# Patient Record
Sex: Male | Born: 1985 | Race: White | Hispanic: No | Marital: Married | Smoking: Never smoker
Health system: Southern US, Community
[De-identification: ages and names within clinical notes are randomized; demographics above are authoritative.]

## PROBLEM LIST (undated history)

## (undated) DIAGNOSIS — E559 Vitamin D deficiency, unspecified: Secondary | ICD-10-CM

## (undated) DIAGNOSIS — E785 Hyperlipidemia, unspecified: Secondary | ICD-10-CM

## (undated) DIAGNOSIS — K219 Gastro-esophageal reflux disease without esophagitis: Secondary | ICD-10-CM

## (undated) HISTORY — DX: Vitamin D deficiency, unspecified: E55.9

## (undated) HISTORY — DX: Hyperlipidemia, unspecified: E78.5

## (undated) HISTORY — PX: MANDIBLE FRACTURE SURGERY: SHX706

## (undated) HISTORY — PX: WISDOM TOOTH EXTRACTION: SHX21

## (undated) HISTORY — DX: Gastro-esophageal reflux disease without esophagitis: K21.9

---
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
## 2005-01-19 ENCOUNTER — Encounter: Admission: RE | Admit: 2005-01-19 | Discharge: 2005-01-19 | Payer: Self-pay | Admitting: Surgery

## 2005-06-06 ENCOUNTER — Ambulatory Visit: Payer: Self-pay | Admitting: Family Medicine

## 2005-12-20 ENCOUNTER — Emergency Department (HOSPITAL_COMMUNITY): Admission: EM | Admit: 2005-12-20 | Discharge: 2005-12-21 | Payer: Self-pay | Admitting: Emergency Medicine

## 2010-08-21 ENCOUNTER — Encounter: Payer: Self-pay | Admitting: Surgery

## 2012-10-15 ENCOUNTER — Telehealth: Payer: Self-pay | Admitting: Family Medicine

## 2012-10-15 NOTE — Telephone Encounter (Signed)
NEEDS TO SCHEDULE APPT 

## 2012-10-15 NOTE — Telephone Encounter (Signed)
PT TO BE CALLED ABOUT APPT

## 2012-10-15 NOTE — Telephone Encounter (Signed)
Can you please call and schedule an appointment.

## 2012-10-31 ENCOUNTER — Telehealth: Payer: Self-pay | Admitting: Family Medicine

## 2012-10-31 NOTE — Telephone Encounter (Signed)
He should call referrals and speak to [facility].

## 2012-10-31 NOTE — Telephone Encounter (Signed)
Spoke with grandmother Antonio [name])  Verbalized understanding to return call in am.

## 2012-11-01 ENCOUNTER — Telehealth: Payer: Self-pay | Admitting: Family Medicine

## 2012-11-01 ENCOUNTER — Encounter: Payer: Self-pay | Admitting: Family Medicine

## 2012-11-01 NOTE — Telephone Encounter (Signed)
He can shave. But needs to be seen to treat the ringworm. Use disposable razors.

## 2012-11-01 NOTE — Telephone Encounter (Signed)
Pt notified ok to shave

## 2012-11-01 NOTE — Telephone Encounter (Signed)
Patient advised that 4/15 with Dr. Jorja Loa was quickest appt. available

## 2012-11-11 ENCOUNTER — Ambulatory Visit: Payer: Self-pay | Admitting: Family Medicine

## 2012-12-12 NOTE — Telephone Encounter (Signed)
Pt notified 11/01/12 by Madison Hickman RN

## 2012-12-12 NOTE — Telephone Encounter (Signed)
error 

## 2012-12-17 ENCOUNTER — Other Ambulatory Visit: Payer: Self-pay

## 2012-12-17 ENCOUNTER — Ambulatory Visit: Payer: Self-pay | Admitting: Family Medicine

## 2012-12-26 ENCOUNTER — Other Ambulatory Visit: Payer: Self-pay

## 2013-01-03 ENCOUNTER — Ambulatory Visit (INDEPENDENT_AMBULATORY_CARE_PROVIDER_SITE_OTHER): Admitting: Family Medicine

## 2013-01-03 ENCOUNTER — Ambulatory Visit (INDEPENDENT_AMBULATORY_CARE_PROVIDER_SITE_OTHER)

## 2013-01-03 ENCOUNTER — Encounter: Payer: Self-pay | Admitting: Family Medicine

## 2013-01-03 VITALS — BP 110/63 | HR 69 | Temp 97.4°F | Wt 154.2 lb

## 2013-01-03 DIAGNOSIS — M25529 Pain in unspecified elbow: Secondary | ICD-10-CM

## 2013-01-03 DIAGNOSIS — M25521 Pain in right elbow: Secondary | ICD-10-CM

## 2013-01-03 LAB — POCT CBC
Granulocyte percent: 67.2 %G (ref 37–80)
HCT, POC: 48 % (ref 43.5–53.7)
Hemoglobin: 16.8 g/dL (ref 14.1–18.1)
Lymph, poc: 1.5 (ref 0.6–3.4)
MCH, POC: 30.6 pg (ref 27–31.2)
MCHC: 35.1 g/dL (ref 31.8–35.4)
MCV: 87.74 fL (ref 80–97)
MPV: 7.7 fL (ref 0–99.8)
POC Granulocyte: 3.5 (ref 2–6.9)
POC LYMPH PERCENT: 28 %L (ref 10–50)
Platelet Count, POC: 324 10*3/uL (ref 142–424)
RBC: 5.5 M/uL (ref 4.69–6.13)
RDW, POC: 11.9 %
WBC: 5.2 10*3/uL (ref 4.6–10.2)

## 2013-01-03 NOTE — Patient Instructions (Addendum)
Take two alleve twice a day with food for 1 week. Apply ice packs and icy hot or ben gays to the arm. Return if not better in a couple of weeks.  Eryck Negron P. Modesto Charon, M.D.

## 2013-01-03 NOTE — Progress Notes (Signed)
Patient ID: Antonio [name], male   DOB: 15-Dec-1985, 27 y.o.   MRN: [id number] SUBJECTIVE: Chief Complaint  Patient presents with  . Follow-up    3 month follow up c/o rt shoulder painful no known injury.  non fasting   HPI: Slept on a bed one night. It was hurting the next day. Got better, then the pain started up again. 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  PMH/PSH: reviewed/updated in Epic  SH/FH: reviewed/updated in Epic  Allergies: reviewed/updated in Epic  Medications: reviewed/updated in Epic  Immunizations: reviewed/updated in Epic  ROS: As above in the HPI. All other systems are stable or negative.  OBJECTIVE: APPEARANCE:  Patient in no acute distress.The patient appeared well nourished and normally developed. Acyanotic. Waist: VITAL SIGNS:BP 110/63  Pulse 69  Temp(Src) 97.4 F (36.3 C) (Oral)  Wt 154 lb 3.2 oz (69.945 kg) Slim built. WM  SKIN: warm and  Dry without overt rashes, tattoos and scars  HEAD and Neck: without JVD, Head and scalp: normal Eyes:No scleral icterus. Fundi normal, eye movements normal. Ears: Auricle normal, canal normal, Tympanic membranes normal, insufflation normal. Nose: normal Throat: normal Neck & thyroid: normal  CHEST & LUNGS: Chest wall: normal Lungs: Clear  CVS: Reveals the PMI to be normally located. Regular rhythm, First and Second Heart sounds are normal,  absence of murmurs, rubs or gallops. Peripheral vasculature: Radial pulses: normal Dorsal pedis pulses: normal Posterior pulses: normal  ABDOMEN:  Appearance: normal Benign, no organomegaly, no masses, no Abdominal Aortic enlargement. No Guarding , no rebound. No Bruits. Bowel sounds: normal  RECTAL: N/A GU: N/A  EXTREMETIES: nonedematous. Both Femoral and Pedal pulses are normal.  MUSCULOSKELETAL:  Spine: normal Joints: intact Right mid humerus tender to palpation and the lateral head of the right triceps is also tender.  NEUROLOGIC: oriented to  time,place and person; nonfocal. Strength is normal Sensory is normal Reflexes are normal Cranial Nerves are normal.  ASSESSMENT: Pain in joint, upper arm, right - Plan: POCT CBC, DG Humerus Right   PLAN: [facility] reading (PRIMARY) by  Dr. Modesto Charon: no acute findings of the humerus. No bone abnormality no fractures.                             Take two alleve twice a day with food for 1 week. Apply ice packs and icy hot or ben gays to the arm. Return if not better in a couple of weeks.  Lakeyn Dokken P. Modesto Charon, M.D.

## 2013-06-03 ENCOUNTER — Ambulatory Visit: Admitting: Family Medicine

## 2013-06-05 ENCOUNTER — Ambulatory Visit: Admitting: Family Medicine

## 2013-08-01 ENCOUNTER — Encounter: Payer: Self-pay | Admitting: Family Medicine

## 2013-08-01 ENCOUNTER — Ambulatory Visit (INDEPENDENT_AMBULATORY_CARE_PROVIDER_SITE_OTHER): Admitting: Family Medicine

## 2013-08-01 ENCOUNTER — Ambulatory Visit (INDEPENDENT_AMBULATORY_CARE_PROVIDER_SITE_OTHER)

## 2013-08-01 VITALS — BP 100/71 | HR 70 | Temp 98.1°F | Ht 67.0 in | Wt 154.4 lb

## 2013-08-01 DIAGNOSIS — R109 Unspecified abdominal pain: Secondary | ICD-10-CM | POA: Insufficient documentation

## 2013-08-01 DIAGNOSIS — R0609 Other forms of dyspnea: Secondary | ICD-10-CM

## 2013-08-01 DIAGNOSIS — E785 Hyperlipidemia, unspecified: Secondary | ICD-10-CM | POA: Insufficient documentation

## 2013-08-01 DIAGNOSIS — R5381 Other malaise: Secondary | ICD-10-CM | POA: Insufficient documentation

## 2013-08-01 DIAGNOSIS — E559 Vitamin D deficiency, unspecified: Secondary | ICD-10-CM

## 2013-08-01 DIAGNOSIS — R0789 Other chest pain: Secondary | ICD-10-CM

## 2013-08-01 DIAGNOSIS — R0989 Other specified symptoms and signs involving the circulatory and respiratory systems: Secondary | ICD-10-CM

## 2013-08-01 DIAGNOSIS — R5383 Other fatigue: Principal | ICD-10-CM

## 2013-08-01 DIAGNOSIS — B354 Tinea corporis: Secondary | ICD-10-CM | POA: Insufficient documentation

## 2013-08-01 DIAGNOSIS — K219 Gastro-esophageal reflux disease without esophagitis: Secondary | ICD-10-CM | POA: Insufficient documentation

## 2013-08-01 DIAGNOSIS — R0600 Dyspnea, unspecified: Secondary | ICD-10-CM

## 2013-08-01 DIAGNOSIS — M25529 Pain in unspecified elbow: Secondary | ICD-10-CM

## 2013-08-01 DIAGNOSIS — R079 Chest pain, unspecified: Secondary | ICD-10-CM | POA: Insufficient documentation

## 2013-08-01 LAB — POCT CBC
Granulocyte percent: 62 %G (ref 37–80)
HCT, POC: 45 % (ref 43.5–53.7)
Hemoglobin: 14.8 g/dL (ref 14.1–18.1)
Lymph, poc: 1.4 (ref 0.6–3.4)
MCH, POC: 28.7 pg (ref 27–31.2)
MCHC: 33 g/dL (ref 31.8–35.4)
MCV: 86.9 fL (ref 80–97)
MPV: 7.5 fL (ref 0–99.8)
POC Granulocyte: 2.8 (ref 2–6.9)
POC LYMPH PERCENT: 30.5 %L (ref 10–50)
Platelet Count, POC: 291 10*3/uL (ref 142–424)
RBC: 5.2 M/uL (ref 4.69–6.13)
RDW, POC: 11.9 %
WBC: 4.5 10*3/uL — AB (ref 4.6–10.2)

## 2013-08-01 MED ORDER — CITALOPRAM HYDROBROMIDE 20 MG PO TABS: 20.0000 mg | ORAL_TABLET | Freq: Every day | ORAL | Status: DC

## 2013-08-01 MED ORDER — PANTOPRAZOLE SODIUM 40 MG PO TBEC: 40.0000 mg | DELAYED_RELEASE_TABLET | Freq: Every day | ORAL | Status: DC

## 2013-08-01 MED ORDER — KETOCONAZOLE 2 % EX CREA: 1.0000 "application " | TOPICAL_CREAM | Freq: Two times a day (BID) | CUTANEOUS | Status: AC

## 2013-08-01 MED ORDER — CHOLECALCIFEROL 50 MCG (2000 UT) PO CAPS: 2000.0000 [IU] | ORAL_CAPSULE | Freq: Every day | ORAL | Status: DC

## 2013-08-01 NOTE — Patient Instructions (Signed)
Depression, Adult Depression refers to feeling sad, low, down in the dumps, blue, gloomy, or empty. In general, there are two kinds of depression: 1. Depression that we all experience from time to time because of upsetting life experiences, including the loss of a job or the ending of a relationship (normal sadness or normal grief). This kind of depression is considered normal, is short lived, and resolves within a few days to 2 weeks. (Depression experienced after the loss of a loved one is called bereavement. Bereavement often lasts longer than 2 weeks but normally gets better with time.) 2. Clinical depression, which lasts longer than normal sadness or normal grief or interferes with your ability to function at home, at work, and in school. It also interferes with your personal relationships. It affects almost every aspect of your life. Clinical depression is an illness. Symptoms of depression also can be caused by conditions other than normal sadness and grief or clinical depression. Examples of these conditions are listed as follows:  Physical illness Some physical illnesses, including underactive thyroid gland (hypothyroidism), severe anemia, specific types of cancer, diabetes, uncontrolled seizures, heart and lung problems, strokes, and chronic pain are commonly associated with symptoms of depression.  Side effects of some prescription medicine In some people, certain types of prescription medicine can cause symptoms of depression.  Substance abuse Abuse of alcohol and illicit drugs can cause symptoms of depression. SYMPTOMS Symptoms of normal sadness and normal grief include the following:  Feeling sad or crying for short periods of time.  Not caring about anything (apathy).  Difficulty sleeping or sleeping too much.  No longer able to enjoy the things you used to enjoy.  Desire to be by oneself all the time (social isolation).  Lack of energy or motivation.  Difficulty  concentrating or remembering.  Change in appetite or weight.  Restlessness or agitation. Symptoms of clinical depression include the same symptoms of normal sadness or normal grief and also the following symptoms:  Feeling sad or crying all the time.  Feelings of guilt or worthlessness.  Feelings of hopelessness or helplessness.  Thoughts of suicide or the desire to harm yourself (suicidal ideation).  Loss of touch with reality (psychotic symptoms). Seeing or hearing things that are not real (hallucinations) or having false beliefs about your life or the people around you (delusions and paranoia). DIAGNOSIS  The diagnosis of clinical depression usually is based on the severity and duration of the symptoms. Your caregiver also will ask you questions about your medical history and substance use to find out if physical illness, use of prescription medicine, or substance abuse is causing your depression. Your caregiver also may order blood tests. TREATMENT  Typically, normal sadness and normal grief do not require treatment. However, sometimes antidepressant medicine is prescribed for bereavement to ease the depressive symptoms until they resolve. The treatment for clinical depression depends on the severity of your symptoms but typically includes antidepressant medicine, counseling with a mental health professional, or a combination of both. Your caregiver will help to determine what treatment is best for you. Depression caused by physical illness usually goes away with appropriate medical treatment of the illness. If prescription medicine is causing depression, talk with your caregiver about stopping the medicine, decreasing the dose, or substituting another medicine. Depression caused by abuse of alcohol or illicit drugs abuse goes away with abstinence from these substances. Some adults need professional help in order to stop drinking or using drugs. SEEK IMMEDIATE CARE IF:  You have   thoughts  about hurting yourself or others.  You lose touch with reality (have psychotic symptoms).  You are taking medicine for depression and have a serious side effect. FOR MORE INFORMATION National Alliance on Mental Illness: www.nami.[employer] of Mental Health: https://carter.com/ Document Released: 07/14/2000 Document Revised: 01/16/2012 Document Reviewed: 10/16/2011 [facility] Patient Information 2014 [place].   Alcohol Problems Most adults who drink alcohol drink in moderation (not a lot) are at low risk for developing problems related to their drinking. However, all drinkers, including low-risk drinkers, should know about the health risks connected with drinking alcohol. RECOMMENDATIONS FOR LOW-RISK DRINKING  Drink in moderation. Moderate drinking is defined as follows:   Men - no more than 2 drinks per day.  Nonpregnant women - no more than 1 drink per day.  Over age 74 - no more than 1 drink per day. A standard drink is 12 grams of pure alcohol, which is equal to a 12 ounce bottle of beer or wine cooler, a 5 ounce glass of wine, or 1.5 ounces of distilled spirits (such as whiskey, brandy, vodka, or rum).  ABSTAIN FROM (DO NOT DRINK) ALCOHOL:  When pregnant or considering pregnancy.  When taking a medication that interacts with alcohol.  If you are alcohol dependent.  A medical condition that prohibits drinking alcohol (such as ulcer, liver disease, or heart disease). DISCUSS WITH YOUR CAREGIVER:  If you are at risk for coronary heart disease, discuss the potential benefits and risks of alcohol use: Light to moderate drinking is associated with lower rates of coronary heart disease in certain populations (for example, men over age 36 and postmenopausal women). Infrequent or nondrinkers are advised not to begin light to moderate drinking to reduce the risk of coronary heart disease so as to avoid creating an alcohol-related problem. Similar protective effects can  likely be gained through proper diet and exercise.  Women and the elderly have smaller amounts of body water than men. As a result women and the elderly achieve a higher blood alcohol concentration after drinking the same amount of alcohol.  Exposing a fetus to alcohol can cause a broad range of birth defects referred to as Fetal Alcohol Syndrome (FAS) or Alcohol-Related Birth Defects (ARBD). Although FAS/ARBD is connected with excessive alcohol consumption during pregnancy, studies also have reported neurobehavioral problems in infants born to mothers reporting drinking an average of 1 drink per day during pregnancy.  Heavier drinking (the consumption of more than 4 drinks per occasion by men and more than 3 drinks per occasion by women) impairs learning (cognitive) and psychomotor functions and increases the risk of alcohol-related problems, including accidents and injuries. CAGE QUESTIONS:   Have you ever felt that you should Cut down on your drinking?  Have people Annoyed you by criticizing your drinking?  Have you ever felt bad or Guilty about your drinking?  Have you ever had a drink first thing in the morning to steady your nerves or get rid of a hangover (Eye opener)? If you answered positively to any of these questions: You may be at risk for alcohol-related problems if alcohol consumption is:   Men: Greater than 14 drinks per week or more than 4 drinks per occasion.  Women: Greater than 7 drinks per week or more than 3 drinks per occasion. Do you or your family have a medical history of alcohol-related problems, such as:  Blackouts.  Sexual dysfunction.  Depression.  Trauma.  Liver dysfunction.  Sleep disorders.  Hypertension.  Chronic abdominal  pain.  Has your drinking ever caused you problems, such as problems with your family, problems with your work (or school) performance, or accidents/injuries?  Do you have a compulsion to drink or a preoccupation with  drinking?  Do you have poor control or are you unable to stop drinking once you have started?  Do you have to drink to avoid withdrawal symptoms?  Do you have problems with withdrawal such as tremors, nausea, sweats, or mood disturbances?  Does it take more alcohol than in the past to get you high?  Do you feel a strong urge to drink?  Do you change your plans so that you can have a drink?  Do you ever drink in the morning to relieve the shakes or a hangover? If you have answered a number of the previous questions positively, it may be time for you to talk to your caregivers, family, and friends and see if they think you have a problem. Alcoholism is a chemical dependency that keeps getting worse and will eventually destroy your health and relationships. Many alcoholics end up dead, impoverished, or in prison. This is often the end result of all chemical dependency.  Do not be discouraged if you are not ready to take action immediately.  Decisions to change behavior often involve up and down desires to change and feeling like you cannot decide.  Try to think more seriously about your drinking behavior.  Think of the reasons to quit. WHERE TO GO FOR ADDITIONAL INFORMATION   The [place] on Alcohol Abuse and Alcoholism (NIAAA) http://www.bradshaw.com/  [employer] on Alcoholism and Drug Dependence (NCADD) www.ncadd.[place] (ASAM) http://carpenter.net/  Document Released: 07/17/2005 Document Revised: 10/09/2011 Document Reviewed: 03/04/2008 [facility] Patient Information 2014 [place].   Gastroesophageal Reflux Disease, Adult Gastroesophageal reflux disease (GERD) happens when acid from your stomach flows up into the esophagus. When acid comes in contact with the esophagus, the acid causes soreness (inflammation) in the esophagus. Over time, GERD may create small holes (ulcers) in the lining of the esophagus. CAUSES   Increased body  weight. This puts pressure on the stomach, making acid rise from the stomach into the esophagus.  Smoking. This increases acid production in the stomach.  Drinking alcohol. This causes decreased pressure in the lower esophageal sphincter (valve or ring of muscle between the esophagus and stomach), allowing acid from the stomach into the esophagus.  Late evening meals and a full stomach. This increases pressure and acid production in the stomach.  A malformed lower esophageal sphincter. Sometimes, no cause is found. SYMPTOMS   Burning pain in the lower part of the mid-chest behind the breastbone and in the mid-stomach area. This may occur twice a week or more often.  Trouble swallowing.  Sore throat.  Dry cough.  Asthma-like symptoms including chest tightness, shortness of breath, or wheezing. DIAGNOSIS  Your caregiver may be able to diagnose GERD based on your symptoms. In some cases, X-rays and other tests may be done to check for complications or to check the condition of your stomach and esophagus. TREATMENT  Your caregiver may recommend over-the-counter or prescription medicines to help decrease acid production. Ask your caregiver before starting or adding any new medicines.  HOME CARE INSTRUCTIONS   Change the factors that you can control. Ask your caregiver for guidance concerning weight loss, quitting smoking, and alcohol consumption.  Avoid foods and drinks that make your symptoms worse, such as:  Caffeine or alcoholic drinks.  Chocolate.  Peppermint or mint flavorings.  Garlic and onions.  Spicy foods.  Citrus fruits, such as oranges, lemons, or limes.  Tomato-based foods such as sauce, chili, salsa, and pizza.  Fried and fatty foods.  Avoid lying down for the 3 hours prior to your bedtime or prior to taking a nap.  Eat small, frequent meals instead of large meals.  Wear loose-fitting clothing. Do not wear anything tight around your waist that causes  pressure on your stomach.  Raise the head of your bed 6 to 8 inches with wood blocks to help you sleep. Extra pillows will not help.  Only take over-the-counter or prescription medicines for pain, discomfort, or fever as directed by your caregiver.  Do not take aspirin, ibuprofen, or other nonsteroidal anti-inflammatory drugs (NSAIDs). SEEK IMMEDIATE MEDICAL CARE IF:   You have pain in your arms, neck, jaw, teeth, or back.  Your pain increases or changes in intensity or duration.  You develop nausea, vomiting, or sweating (diaphoresis).  You develop shortness of breath, or you faint.  Your vomit is green, yellow, black, or looks like coffee grounds or blood.  Your stool is red, bloody, or black. These symptoms could be signs of other problems, such as heart disease, gastric bleeding, or esophageal bleeding. MAKE SURE YOU:   Understand these instructions.  Will watch your condition.  Will get help right away if you are not doing well or get worse. Document Released: 04/26/2005 Document Revised: 10/09/2011 Document Reviewed: 02/03/2011 [facility] Patient Information 2014 [place], [place].
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
## 2013-08-05 LAB — CMP14+EGFR
ALT: 15 IU/L (ref 0–44)
AST: 20 IU/L (ref 0–40)
Albumin/Globulin Ratio: 2.1 (ref 1.1–2.5)
Albumin: 4.6 g/dL (ref 3.5–5.5)
Alkaline Phosphatase: 74 IU/L (ref 39–117)
BUN/Creatinine Ratio: 11 (ref 8–19)
BUN: 11 mg/dL (ref 6–20)
CO2: 26 mmol/L (ref 18–29)
Calcium: 10 mg/dL (ref 8.7–10.2)
Chloride: 97 mmol/L (ref 97–108)
Creatinine, Ser: 1.01 mg/dL (ref 0.76–1.27)
GFR calc Af Amer: 117 mL/min/{1.73_m2} (ref >59)
GFR calc non Af Amer: 101 mL/min/{1.73_m2} (ref >59)
Globulin, Total: 2.2 g/dL (ref 1.5–4.5)
Glucose: 88 mg/dL (ref 65–99)
Potassium: 4.7 mmol/L (ref 3.5–5.2)
Sodium: 140 mmol/L (ref 134–144)
Total Bilirubin: 1 mg/dL (ref 0.0–1.2)
Total Protein: 6.8 g/dL (ref 6.0–8.5)

## 2013-08-05 LAB — FOLATE: Folate: 18.9 ng/mL (ref >3.0)

## 2013-08-05 LAB — VITAMIN D 25 HYDROXY (VIT D DEFICIENCY, FRACTURES): Vit D, 25-Hydroxy: 15.2 ng/mL — ABNORMAL LOW (ref 30.0–100.0)

## 2013-08-05 LAB — HIV ANTIBODY (ROUTINE TESTING W REFLEX)
HIV 1/O/2 Abs-Index Value: <1 (ref <1.00)
HIV-1/HIV-2 Ab: NONREACTIVE

## 2013-08-05 LAB — LIPID PANEL
Chol/HDL Ratio: 5.2 ratio units — ABNORMAL HIGH (ref 0.0–5.0)
Cholesterol, Total: 192 mg/dL (ref 100–199)
HDL: 37 mg/dL — ABNORMAL LOW (ref >39)
LDL Calculated: 121 mg/dL — ABNORMAL HIGH (ref 0–99)
Triglycerides: 168 mg/dL — ABNORMAL HIGH (ref 0–149)
VLDL Cholesterol Cal: 34 mg/dL (ref 5–40)

## 2013-08-05 LAB — HEPATITIS PANEL, ACUTE
Hep A IgM: NEGATIVE
Hep B C IgM: NEGATIVE
Hep C Virus Ab: 0.1 s/co ratio (ref 0.0–0.9)
Hepatitis B Surface Ag: NEGATIVE

## 2013-08-05 LAB — SEDIMENTATION RATE: Sed Rate: 2 mm/hr (ref 0–15)

## 2013-08-05 LAB — H PYLORI, IGM, IGG, IGA AB
H Pylori IgG: <0.9 U/mL (ref 0.0–0.8)
H. pylori, IgA Abs: <9 units (ref 0.0–8.9)
H. pylori, IgM Abs: <9 units (ref 0.0–8.9)

## 2013-08-05 LAB — TSH: TSH: 1.59 u[IU]/mL (ref 0.450–4.500)

## 2013-08-05 LAB — VITAMIN B12: Vitamin B-12: 663 pg/mL (ref 211–946)

## 2013-08-05 LAB — RPR: RPR: NONREACTIVE

## 2013-08-06 NOTE — Progress Notes (Signed)
Quick Note:  Call Patient Labs that are abnormal: Vit D too low Lipids are abnormal The rest are at goal  Recommendations: Double your Vit d dose to 4000 units daily. This is OTC. Will discuss at follow up visit in 4 weeks.   ______

## 2013-08-12 ENCOUNTER — Telehealth: Payer: Self-pay | Admitting: *Deleted

## 2013-08-12 NOTE — Telephone Encounter (Signed)
RETURNING NURSE CALL FOR LAB RESULTS.  CALL HIM AT [phone]

## 2013-09-02 ENCOUNTER — Telehealth: Payer: Self-pay | Admitting: Family Medicine

## 2013-09-02 ENCOUNTER — Other Ambulatory Visit: Payer: Self-pay | Admitting: Family Medicine

## 2013-09-02 ENCOUNTER — Ambulatory Visit: Admitting: Family Medicine

## 2013-09-02 DIAGNOSIS — R5383 Other fatigue: Secondary | ICD-10-CM

## 2013-09-02 DIAGNOSIS — K219 Gastro-esophageal reflux disease without esophagitis: Secondary | ICD-10-CM

## 2013-09-02 DIAGNOSIS — R5381 Other malaise: Secondary | ICD-10-CM

## 2013-09-02 MED ORDER — PANTOPRAZOLE SODIUM 40 MG PO TBEC: 40.0000 mg | DELAYED_RELEASE_TABLET | Freq: Every day | ORAL | Status: DC

## 2013-09-02 MED ORDER — CITALOPRAM HYDROBROMIDE 20 MG PO TABS: 20.0000 mg | ORAL_TABLET | Freq: Every day | ORAL | Status: DC

## 2013-09-02 NOTE — Telephone Encounter (Signed)
Call patient : Prescription refilled & sent to pharmacy in [place]. 

## 2013-09-08 ENCOUNTER — Ambulatory Visit (INDEPENDENT_AMBULATORY_CARE_PROVIDER_SITE_OTHER): Admitting: Family Medicine

## 2013-09-08 ENCOUNTER — Encounter: Payer: Self-pay | Admitting: Family Medicine

## 2013-09-08 VITALS — BP 102/64 | HR 79 | Temp 97.5°F | Ht 67.0 in | Wt 148.6 lb

## 2013-09-08 DIAGNOSIS — L739 Follicular disorder, unspecified: Secondary | ICD-10-CM | POA: Insufficient documentation

## 2013-09-08 DIAGNOSIS — R5381 Other malaise: Secondary | ICD-10-CM

## 2013-09-08 DIAGNOSIS — B079 Viral wart, unspecified: Secondary | ICD-10-CM

## 2013-09-08 DIAGNOSIS — L738 Other specified follicular disorders: Secondary | ICD-10-CM

## 2013-09-08 DIAGNOSIS — K219 Gastro-esophageal reflux disease without esophagitis: Secondary | ICD-10-CM

## 2013-09-08 DIAGNOSIS — E785 Hyperlipidemia, unspecified: Secondary | ICD-10-CM

## 2013-09-08 DIAGNOSIS — Z8349 Family history of other endocrine, nutritional and metabolic diseases: Secondary | ICD-10-CM | POA: Insufficient documentation

## 2013-09-08 DIAGNOSIS — E559 Vitamin D deficiency, unspecified: Secondary | ICD-10-CM

## 2013-09-08 DIAGNOSIS — R5383 Other fatigue: Principal | ICD-10-CM

## 2013-09-08 DIAGNOSIS — B078 Other viral warts: Secondary | ICD-10-CM | POA: Insufficient documentation

## 2013-09-08 DIAGNOSIS — L678 Other hair color and hair shaft abnormalities: Secondary | ICD-10-CM

## 2013-09-08 MED ORDER — SULFAMETHOXAZOLE-TMP DS 800-160 MG PO TABS: 1.0000 | ORAL_TABLET | Freq: Two times a day (BID) | ORAL | Status: DC

## 2013-09-08 NOTE — Patient Instructions (Signed)
      Dr Derrian Poli's Recommendations  For nutrition information, I recommend books:  1).Eat to Live by Dr Joel Fuhrman. 2).Prevent and Reverse Heart Disease by Dr Caldwell Esselstyn. 3) Dr Neal Barnard's Book:  Program to Reverse Diabetes  Exercise recommendations are:  If unable to walk, then the patient can exercise in a chair 3 times a day. By flapping arms like a bird gently and raising legs outwards to the front.  If ambulatory, the patient can go for walks for 30 minutes 3 times a week. Then increase the intensity and duration as tolerated.  Goal is to try to attain exercise frequency to 5 times a week.  If applicable: Best to perform resistance exercises (machines or weights) 2 days a week and cardio type exercises 3 days per week.  

## 2013-09-08 NOTE — Progress Notes (Signed)
Patient ID: Antonio [name], male   DOB: Sep 10, 1985, 28 y.o.   MRN: [id number] SUBJECTIVE: CC: Chief Complaint  Patient presents with  . Follow-up    4 week reck since starting celexa "mood is better"    HPI: Fatigue and mood has been better. But did not know that he has refills and ran out 1 week ago of the citalopram  Has only taken vit D 2000 units. Instead of the 4000 units as recommended.  Came for follow up.  Has warts on his palms.  GERD is better with the pantoprazole Past Medical History  Diagnosis Date  . GERD (gastroesophageal reflux disease)   . Vitamin D deficiency   . HLD (hyperlipidemia)    No past surgical history on file. History   Social History  . Marital Status: Married    Spouse Name: N/A    Number of Children: N/A  . Years of Education: N/A   Occupational History  . Not on file.   Social History Main Topics  . Smoking status: Never Smoker   . Smokeless tobacco: Not on file  . Alcohol Use: Not on file  . Drug Use: Not on file  . Sexual Activity: Not on file   Other Topics Concern  . Not on file   Social History Narrative  . No narrative on file   No family history on file. Current Outpatient Prescriptions on File Prior to Visit  Medication Sig Dispense Refill  . citalopram (CELEXA) 20 MG tablet Take 1 tablet (20 mg total) by mouth daily.  30 tablet  3  . pantoprazole (PROTONIX) 40 MG tablet Take 1 tablet (40 mg total) by mouth daily.  30 tablet  3   No current facility-administered medications on file prior to visit.   No Known Allergies Immunization History  Administered Date(s) Administered  . Influenza-Unspecified 06/09/2013  . Tdap 08/01/2007   Prior to Admission medications   Medication Sig Start Date End Date Taking? Authorizing Provider  Cholecalciferol 2000 UNITS CAPS Take 1 capsule (2,000 Units total) by mouth daily. 08/01/13  Yes Vernie Shanks, MD  citalopram (CELEXA) 20 MG tablet Take 1 tablet (20 mg total) by mouth  daily. 09/02/13  Yes Vernie Shanks, MD  pantoprazole (PROTONIX) 40 MG tablet Take 1 tablet (40 mg total) by mouth daily. 09/02/13  Yes Vernie Shanks, MD     ROS: As above in the HPI. All other systems are stable or negative.  OBJECTIVE: APPEARANCE:  Patient in no acute distress.The patient appeared well nourished and normally developed. Acyanotic. Waist: VITAL SIGNS:BP 102/64  Pulse 79  Temp(Src) 97.5 F (36.4 C) (Oral)  Ht '5\' 7"'  (1.702 m)  Wt 148 lb 9.6 oz (67.405 kg)  BMI 23.27 kg/m2 WM  SKIN: warm and  Dry verrucous warts of the medial palms of the right hand.  Has impetigo lesion in the occiput with the hair follicles.  HEAD and Neck: without JVD, Head and scalp: normal Eyes:No scleral icterus. Fundi normal, eye movements normal. Ears: Auricle normal, canal normal, Tympanic membranes normal, insufflation normal. Nose: normal Throat: normal Neck & thyroid: normal  CHEST & LUNGS: Chest wall: normal Lungs: Clear  CVS: Reveals the PMI to be normally located. Regular rhythm, First and Second Heart sounds are normal,  absence of murmurs, rubs or gallops. Peripheral vasculature: Radial pulses: normal Dorsal pedis pulses: normal Posterior pulses: normal  ABDOMEN:  Appearance: normal Benign, no organomegaly, no masses, no Abdominal Aortic enlargement. No Guarding , no  rebound. No Bruits. Bowel sounds: normal  RECTAL: N/A GU: N/A  EXTREMETIES: nonedematous.  MUSCULOSKELETAL:  Spine: normal Joints: intact  NEUROLOGIC: oriented to time,place and person; nonfocal. Strength is normal Sensory is normal Reflexes are normal Cranial Nerves are normal. Psychiatry: fatigue better.no delusion no hallucinations and no suicidal thoughts.  Results for orders placed in visit on 08/01/13  CMP14+EGFR      Result Value Range   Glucose 88  65 - 99 mg/dL   BUN 11  6 - 20 mg/dL   Creatinine, Ser 1.01  0.76 - 1.27 mg/dL   GFR calc non Af Amer 101  >59 mL/min/1.73   GFR  calc Af Amer 117  >59 mL/min/1.73   BUN/Creatinine Ratio 11  8 - 19   Sodium 140  134 - 144 mmol/L   Potassium 4.7  3.5 - 5.2 mmol/L   Chloride 97  97 - 108 mmol/L   CO2 26  18 - 29 mmol/L   Calcium 10.0  8.7 - 10.2 mg/dL   Total Protein 6.8  6.0 - 8.5 g/dL   Albumin 4.6  3.5 - 5.5 g/dL   Globulin, Total 2.2  1.5 - 4.5 g/dL   Albumin/Globulin Ratio 2.1  1.1 - 2.5   Total Bilirubin 1.0  0.0 - 1.2 mg/dL   Alkaline Phosphatase 74  39 - 117 IU/L   AST 20  0 - 40 IU/L   ALT 15  0 - 44 IU/L  VITAMIN D 25 HYDROXY      Result Value Range   Vit D, 25-Hydroxy 15.2 (*) 30.0 - 100.0 ng/mL  VITAMIN B12      Result Value Range   Vitamin B-12 663  211 - 946 pg/mL  FOLATE      Result Value Range   Folate 18.9  >3.0 ng/mL  SEDIMENTATION RATE      Result Value Range   Sed Rate 2  0 - 15 mm/hr  RPR      Result Value Range   RPR Non Reactive  Non Reactive  HEPATITIS PANEL, ACUTE      Result Value Range   Hep A IgM Negative  Negative   Hepatitis B Surface Ag Negative  Negative   Hep B C IgM Negative  Negative   Hep C Virus Ab 0.1  0.0 - 0.9 s/co ratio  HIV ANTIBODY (ROUTINE TESTING)      Result Value Range   HIV 1/O/2 Abs-Index Value <1.00  <1.00   HIV-1/HIV-2 Ab Non Reactive  Non Reactive  LIPID PANEL      Result Value Range   Cholesterol, Total 192  100 - 199 mg/dL   Triglycerides 168 (*) 0 - 149 mg/dL   HDL 37 (*) >39 mg/dL   VLDL Cholesterol Cal 34  5 - 40 mg/dL   LDL Calculated 121 (*) 0 - 99 mg/dL   Chol/HDL Ratio 5.2 (*) 0.0 - 5.0 ratio units  TSH      Result Value Range   TSH 1.590  0.450 - 4.500 uIU/mL  H PYLORI, IGM, IGG, IGA AB      Result Value Range   H Pylori IgG <0.9  0.0 - 0.8 U/mL   H. pylori, IgA Abs <9.0  0.0 - 8.9 units   H. pylori, IgM Abs <9.0  0.0 - 8.9 units  POCT CBC      Result Value Range   WBC 4.5 (*) 4.6 - 10.2 K/uL   Lymph, poc 1.4  0.6 -  3.4   POC LYMPH PERCENT 30.5  10 - 50 %L   MID (cbc)    0 - 0.9   POC MID %    0 - 12 %M   POC Granulocyte  2.8  2 - 6.9   Granulocyte percent 62.0  37 - 80 %G   RBC 5.2  4.69 - 6.13 M/uL   Hemoglobin 14.8  14.1 - 18.1 g/dL   HCT, POC 45.0  43.5 - 53.7 %   MCV 86.9  80 - 97 fL   MCH, POC 28.7  27 - 31.2 pg   MCHC 33.0  31.8 - 35.4 g/dL   RDW, POC 11.9     Platelet Count, POC 291.0  142 - 424 K/uL   MPV 7.5  0 - 99.8 fL    ASSESSMENT:  Other malaise and fatigue  HLD (hyperlipidemia)  Vitamin D deficiency - Plan: Cholecalciferol 2000 UNITS CAPS  GERD (gastroesophageal reflux disease)  Follicular impetigo - Plan: sulfamethoxazole-trimethoprim (BACTRIM DS) 800-160 MG per tablet  Wart viral  Family history of hemochromatosis Need for compliance and explained that he has refills.  PLAN: Compliance discussed Skin care Duct  Tape treatment for warts discussed. Consider increase the dose of the citalopram in 4 weeks.  No orders of the defined types were placed in this encounter.   Meds ordered this encounter  Medications  . sulfamethoxazole-trimethoprim (BACTRIM DS) 800-160 MG per tablet    Sig: Take 1 tablet by mouth 2 (two) times daily.    Dispense:  20 tablet    Refill:  0  . Cholecalciferol 2000 UNITS CAPS    Sig: Take 2 capsules (4,000 Units total) by mouth daily.    Dispense:  60 each    Refill:  3   Medications Discontinued During This Encounter  Medication Reason  . Cholecalciferol 2000 UNITS CAPS Reorder       Dr Paula Libra Recommendations  For nutrition information, I recommend books:  1).Eat to Live by Dr Excell Seltzer. 2).Prevent and Reverse Heart Disease by Dr Karl Luke. 3) Dr Janene Harvey Book:  Program to Reverse Diabetes  Exercise recommendations are:  If unable to walk, then the patient can exercise in a chair 3 times a day. By flapping arms like a bird gently and raising legs outwards to the front.  If ambulatory, the patient can go for walks for 30 minutes 3 times a week. Then increase the intensity and duration as tolerated.  Goal is  to try to attain exercise frequency to 5 times a week.  If applicable: Best to perform resistance exercises (machines or weights) 2 days a week and cardio type exercises 3 days per week.  Return in about 4 weeks (around 10/06/2013) for Recheck medical problems.  Diantha Paxson P. Jacelyn Grip, M.D.

## 2013-10-09 ENCOUNTER — Encounter: Payer: Self-pay | Admitting: Family Medicine

## 2013-10-09 ENCOUNTER — Ambulatory Visit (INDEPENDENT_AMBULATORY_CARE_PROVIDER_SITE_OTHER): Admitting: Family Medicine

## 2013-10-09 VITALS — BP 117/79 | HR 60 | Temp 97.4°F | Ht 69.0 in | Wt 147.2 lb

## 2013-10-09 DIAGNOSIS — L738 Other specified follicular disorders: Secondary | ICD-10-CM

## 2013-10-09 DIAGNOSIS — L739 Follicular disorder, unspecified: Secondary | ICD-10-CM

## 2013-10-09 DIAGNOSIS — K219 Gastro-esophageal reflux disease without esophagitis: Secondary | ICD-10-CM

## 2013-10-09 DIAGNOSIS — E559 Vitamin D deficiency, unspecified: Secondary | ICD-10-CM

## 2013-10-09 DIAGNOSIS — B079 Viral wart, unspecified: Secondary | ICD-10-CM

## 2013-10-09 DIAGNOSIS — R5381 Other malaise: Secondary | ICD-10-CM

## 2013-10-09 DIAGNOSIS — R5383 Other fatigue: Principal | ICD-10-CM

## 2013-10-09 DIAGNOSIS — E785 Hyperlipidemia, unspecified: Secondary | ICD-10-CM

## 2013-10-09 DIAGNOSIS — Z8349 Family history of other endocrine, nutritional and metabolic diseases: Secondary | ICD-10-CM

## 2013-10-09 NOTE — Progress Notes (Signed)
Patient ID: Antonio [name], male   DOB: [birth year], 28 y.o.   MRN: [id number] SUBJECTIVE: CC: Chief Complaint  Patient presents with  . Follow-up    1 month follow up fatigue' about the same"    HPI: Stable Here for follow up of his fatigue and Vitamin D Def. Patient is here for follow up of hyperlipidemia: denies Headache;denies Chest Pain;denies weakness;denies Shortness of Breath and orthopnea;denies Visual changes;denies palpitations;denies cough;denies pedal edema;denies symptoms of TIA or stroke;deniesClaudication symptoms. admits to Compliance with medications; denies Problems with medications.   Past Medical History  Diagnosis Date  . GERD (gastroesophageal reflux disease)   . Vitamin D deficiency   . HLD (hyperlipidemia)    No past surgical history on file. History   Social History  . Marital Status: Married    Spouse Name: N/A    Number of Children: N/A  . Years of Education: N/A   Occupational History  . Not on file.   Social History Main Topics  . Smoking status: Never Smoker   . Smokeless tobacco: Not on file  . Alcohol Use: Not on file  . Drug Use: Not on file  . Sexual Activity: Not on file   Other Topics Concern  . Not on file   Social History Narrative  . No narrative on file   No family history on file. Current Outpatient Prescriptions on File Prior to Visit  Medication Sig Dispense Refill  . Cholecalciferol 2000 UNITS CAPS Take 2 capsules (4,000 Units total) by mouth daily.  60 each  3  . citalopram (CELEXA) 20 MG tablet Take 1 tablet (20 mg total) by mouth daily.  30 tablet  3  . pantoprazole (PROTONIX) 40 MG tablet Take 1 tablet (40 mg total) by mouth daily.  30 tablet  3  . sulfamethoxazole-trimethoprim (BACTRIM DS) 800-160 MG per tablet Take 1 tablet by mouth 2 (two) times daily.  20 tablet  0   No current facility-administered medications on file prior to visit.   No Known Allergies Immunization History  Administered Date(s)  Administered  . Influenza-Unspecified 06/09/2013  . Tdap 08/01/2007   Prior to Admission medications   Medication Sig Start Date End Date Taking? Authorizing Provider  Cholecalciferol 2000 UNITS CAPS Take 2 capsules (4,000 Units total) by mouth daily. 09/08/13  Yes Vernie Shanks, MD  citalopram (CELEXA) 20 MG tablet Take 1 tablet (20 mg total) by mouth daily. 09/02/13  Yes Vernie Shanks, MD  pantoprazole (PROTONIX) 40 MG tablet Take 1 tablet (40 mg total) by mouth daily. 09/02/13  Yes Vernie Shanks, MD  sulfamethoxazole-trimethoprim (BACTRIM DS) 800-160 MG per tablet Take 1 tablet by mouth 2 (two) times daily. 09/08/13   Vernie Shanks, MD     ROS: As above in the HPI. All other systems are stable or negative.  OBJECTIVE: APPEARANCE:  Patient in no acute distress.The patient appeared well nourished and normally developed. Acyanotic. Waist: VITAL SIGNS:BP 117/79  Pulse 60  Temp(Src) 97.4 F (36.3 C) (Oral)  Ht '5\' 9"'  (1.753 m)  Wt 147 lb 3.2 oz (66.769 kg)  BMI 21.73 kg/m2 WM looks well  SKIN: warm and  Dry without overt rashes, tattoos and scars  HEAD and Neck: without JVD, Head and scalp: normal Eyes:No scleral icterus. Fundi normal, eye movements normal. Ears: Auricle normal, canal normal, Tympanic membranes normal, insufflation normal. Nose: normal Throat: normal Neck & thyroid: normal  CHEST & LUNGS: Chest wall: normal Lungs: Clear  CVS: Reveals the PMI  to be normally located. Regular rhythm, First and Second Heart sounds are normal,  absence of murmurs, rubs or gallops. Peripheral vasculature: Radial pulses: normal Dorsal pedis pulses: normal Posterior pulses: normal  ABDOMEN:  Appearance: normal Benign, no organomegaly, no masses, no Abdominal Aortic enlargement. No Guarding , no rebound. No Bruits. Bowel sounds: normal  RECTAL: N/A GU: N/A  EXTREMETIES: nonedematous.  MUSCULOSKELETAL:  Spine: normal Joints: intact  NEUROLOGIC: oriented to  time,place and person; nonfocal. Strength is normal Sensory is normal Reflexes are normal Cranial Nerves are normal. Psyche: no suicidal ideation. See screen.  Results for orders placed in visit on 08/01/13  CMP14+EGFR      Result Value Ref Range   Glucose 88  65 - 99 mg/dL   BUN 11  6 - 20 mg/dL   Creatinine, Ser 1.01  0.76 - 1.27 mg/dL   GFR calc non Af Amer 101  >59 mL/min/1.73   GFR calc Af Amer 117  >59 mL/min/1.73   BUN/Creatinine Ratio 11  8 - 19   Sodium 140  134 - 144 mmol/L   Potassium 4.7  3.5 - 5.2 mmol/L   Chloride 97  97 - 108 mmol/L   CO2 26  18 - 29 mmol/L   Calcium 10.0  8.7 - 10.2 mg/dL   Total Protein 6.8  6.0 - 8.5 g/dL   Albumin 4.6  3.5 - 5.5 g/dL   Globulin, Total 2.2  1.5 - 4.5 g/dL   Albumin/Globulin Ratio 2.1  1.1 - 2.5   Total Bilirubin 1.0  0.0 - 1.2 mg/dL   Alkaline Phosphatase 74  39 - 117 IU/L   AST 20  0 - 40 IU/L   ALT 15  0 - 44 IU/L  VITAMIN D 25 HYDROXY      Result Value Ref Range   Vit D, 25-Hydroxy 15.2 (*) 30.0 - 100.0 ng/mL  VITAMIN B12      Result Value Ref Range   Vitamin B-12 663  211 - 946 pg/mL  FOLATE      Result Value Ref Range   Folate 18.9  >3.0 ng/mL  SEDIMENTATION RATE      Result Value Ref Range   Sed Rate 2  0 - 15 mm/hr  RPR      Result Value Ref Range   RPR Non Reactive  Non Reactive  HEPATITIS PANEL, ACUTE      Result Value Ref Range   Hep A IgM Negative  Negative   Hepatitis B Surface Ag Negative  Negative   Hep B C IgM Negative  Negative   Hep C Virus Ab 0.1  0.0 - 0.9 s/co ratio  HIV ANTIBODY (ROUTINE TESTING)      Result Value Ref Range   HIV 1/O/2 Abs-Index Value <1.00  <1.00   HIV-1/HIV-2 Ab Non Reactive  Non Reactive  LIPID PANEL      Result Value Ref Range   Cholesterol, Total 192  100 - 199 mg/dL   Triglycerides 168 (*) 0 - 149 mg/dL   HDL 37 (*) >39 mg/dL   VLDL Cholesterol Cal 34  5 - 40 mg/dL   LDL Calculated 121 (*) 0 - 99 mg/dL   Chol/HDL Ratio 5.2 (*) 0.0 - 5.0 ratio units  TSH       Result Value Ref Range   TSH 1.590  0.450 - 4.500 uIU/mL  H PYLORI, IGM, IGG, IGA AB      Result Value Ref Range   H Pylori IgG <  0.9  0.0 - 0.8 U/mL   H. pylori, IgA Abs <9.0  0.0 - 8.9 units   H. pylori, IgM Abs <9.0  0.0 - 8.9 units  POCT CBC      Result Value Ref Range   WBC 4.5 (*) 4.6 - 10.2 K/uL   Lymph, poc 1.4  0.6 - 3.4   POC LYMPH PERCENT 30.5  10 - 50 %L   MID (cbc)    0 - 0.9   POC MID %    0 - 12 %M   POC Granulocyte 2.8  2 - 6.9   Granulocyte percent 62.0  37 - 80 %G   RBC 5.2  4.69 - 6.13 M/uL   Hemoglobin 14.8  14.1 - 18.1 g/dL   HCT, POC 45.0  43.5 - 53.7 %   MCV 86.9  80 - 97 fL   MCH, POC 28.7  27 - 31.2 pg   MCHC 33.0  31.8 - 35.4 g/dL   RDW, POC 11.9     Platelet Count, POC 291.0  142 - 424 K/uL   MPV 7.5  0 - 99.8 fL    ASSESSMENT: Other malaise and fatigue  HLD (hyperlipidemia)  Follicular impetigo  Vitamin D deficiency - Plan: Vit D  25 hydroxy (rtn osteoporosis monitoring)  Wart viral  GERD (gastroesophageal reflux disease)  Family history of hemochromatosis  PLAN: Reviewed labs with patient.  He is  Stable  No change in regimen.   Orders Placed This Encounter  Procedures  . Vit D  25 hydroxy (rtn osteoporosis monitoring)   No orders of the defined types were placed in this encounter.   There are no discontinued medications. Return in about 4 months (around 02/08/2014) for Recheck medical problems.  Bodee Lafoe P. Jacelyn Grip, M.D.

## 2013-10-10 LAB — VITAMIN D 25 HYDROXY (VIT D DEFICIENCY, FRACTURES): Vit D, 25-Hydroxy: 31 ng/mL (ref 30.0–100.0)

## 2013-12-16 ENCOUNTER — Ambulatory Visit (INDEPENDENT_AMBULATORY_CARE_PROVIDER_SITE_OTHER): Admitting: Physician Assistant

## 2013-12-16 ENCOUNTER — Encounter: Payer: Self-pay | Admitting: Physician Assistant

## 2013-12-16 VITALS — BP 99/62 | HR 68 | Temp 97.7°F | Ht 69.0 in | Wt 143.6 lb

## 2013-12-16 DIAGNOSIS — F329 Major depressive disorder, single episode, unspecified: Secondary | ICD-10-CM

## 2013-12-16 DIAGNOSIS — R5383 Other fatigue: Secondary | ICD-10-CM

## 2013-12-16 DIAGNOSIS — F32A Depression, unspecified: Secondary | ICD-10-CM

## 2013-12-16 DIAGNOSIS — R5381 Other malaise: Secondary | ICD-10-CM

## 2013-12-16 DIAGNOSIS — F3289 Other specified depressive episodes: Secondary | ICD-10-CM

## 2013-12-16 MED ORDER — CITALOPRAM HYDROBROMIDE 20 MG PO TABS: 20.0000 mg | ORAL_TABLET | Freq: Every day | ORAL | Status: DC

## 2013-12-16 NOTE — Patient Instructions (Signed)

## 2013-12-16 NOTE — Progress Notes (Signed)
Subjective:     Patient ID: Antonio [name], male   DOB: Feb 11, 1986, 28 y.o.   MRN: [id number]  HPI Pt here for f/u of depression Seen by Dr Jacelyn Grip and started on Celexa in Jan Pt thought he had no more refills and has been out for 5 days He recently found out he does have rf and just picked Wants to know if he should come off of the meds   Review of Systems Denies any feelings of harming self or others    Objective:   Physical Exam Mood is approp Good eye contact Approp answers to questions    Assessment:     Depression    Plan:     Discussed with pt we like to keep pt on meds for at least 9 months and release rates are lower Reviewed all of the SE of the med since he is restarting Med has done well and he has tolerated well Recheck in 6 months and possible wean at that time

## 2013-12-24 ENCOUNTER — Ambulatory Visit (INDEPENDENT_AMBULATORY_CARE_PROVIDER_SITE_OTHER): Admitting: Family Medicine

## 2013-12-24 ENCOUNTER — Encounter: Payer: Self-pay | Admitting: Family Medicine

## 2013-12-24 ENCOUNTER — Ambulatory Visit (INDEPENDENT_AMBULATORY_CARE_PROVIDER_SITE_OTHER)

## 2013-12-24 VITALS — BP 108/71 | HR 79 | Temp 97.6°F | Ht 69.0 in | Wt 144.0 lb

## 2013-12-24 DIAGNOSIS — S6992XA Unspecified injury of left wrist, hand and finger(s), initial encounter: Secondary | ICD-10-CM

## 2013-12-24 DIAGNOSIS — S6990XA Unspecified injury of unspecified wrist, hand and finger(s), initial encounter: Secondary | ICD-10-CM

## 2013-12-24 NOTE — Progress Notes (Signed)
   Subjective:    Patient ID: Antonio [name], male    DOB: 1985-09-20, 28 y.o.   MRN: [id number]  HPI C/o left hand injury a week ago and was having some pain in his left hand and wrist region and now it is better but wanted to come in and see if it is better.  He states he has mild discomfort in the hand and wrist area.   Review of Systems C/o left hand discomfort   No chest pain, SOB, HA, dizziness, vision change, N/V, diarrhea, constipation, dysuria, urinary urgency or frequency, myalgias, arthralgias or rash.  Objective:   Physical Exam  TTP left hand.  FROM left hand and left wrist.  No swelling or deformity noted  Xray left hand - normal    Assessment & Plan:  Injury of left hand - Plan: DG Hand Complete Left Take NSAIDS otc as directed and reassurance given  Lysbeth Penner FNP

## 2013-12-25 ENCOUNTER — Ambulatory Visit (INDEPENDENT_AMBULATORY_CARE_PROVIDER_SITE_OTHER): Admitting: Family Medicine

## 2013-12-25 ENCOUNTER — Encounter: Payer: Self-pay | Admitting: Family Medicine

## 2013-12-25 VITALS — BP 116/71 | HR 74 | Temp 97.5°F | Ht 69.0 in | Wt 144.0 lb

## 2013-12-25 DIAGNOSIS — K4090 Unilateral inguinal hernia, without obstruction or gangrene, not specified as recurrent: Secondary | ICD-10-CM

## 2013-12-26 NOTE — Progress Notes (Signed)
   Subjective:    Patient ID: Antonio [name], male    DOB: [birth year], 28 y.o.   MRN: [id number]  HPI  C/o left inguinal mass for 2 weeks that is mildly painful  Review of Systems    No chest pain, SOB, HA, dizziness, vision change, N/V, diarrhea, constipation, dysuria, urinary urgency or frequency, myalgias, arthralgias or rash.  Objective:   Physical Exam Vital signs noted  Well developed well nourished male.  HEENT - Head atraumatic Normocephalic Respiratory - Lungs CTA bilateral Cardiac - RRR S1 and S2 without murmur GU - Left inguinal hernia       Assessment & Plan:  Left inguinal hernia - Plan: Ambulatory referral to [facility] FNP

## 2014-01-02 ENCOUNTER — Ambulatory Visit (INDEPENDENT_AMBULATORY_CARE_PROVIDER_SITE_OTHER): Admitting: General Surgery

## 2014-01-07 ENCOUNTER — Encounter (INDEPENDENT_AMBULATORY_CARE_PROVIDER_SITE_OTHER): Payer: Self-pay | Admitting: General Surgery

## 2014-01-07 ENCOUNTER — Ambulatory Visit (INDEPENDENT_AMBULATORY_CARE_PROVIDER_SITE_OTHER): Admitting: General Surgery

## 2014-01-07 VITALS — BP 124/76 | HR 75 | Temp 97.6°F | Resp 16 | Ht 67.0 in | Wt 143.0 lb

## 2014-01-07 DIAGNOSIS — K4090 Unilateral inguinal hernia, without obstruction or gangrene, not specified as recurrent: Secondary | ICD-10-CM

## 2014-01-07 NOTE — Progress Notes (Signed)
Chief complaint: Hernia left groin  History: Patient is a pleasant 28 year old male referred by Dr. Redge Gainer for an apparent left inguinal hernia. He has noticed a bulge in his left groin for about 2 months. He recently went for evaluation and was felt to have a hernia and was referred. He has had no pain in the area. No previous hernia repairs. He has some chronic reflux but no other GI symptoms. No urinary symptoms. He states that his primary has been possibly able to feel a hernia on the right although he has not felt a definite bulge there.  Past Medical History  Diagnosis Date  . GERD (gastroesophageal reflux disease)   . Vitamin D deficiency   . HLD (hyperlipidemia)    Past Surgical History  Procedure Laterality Date  . Mandible fracture surgery     Current Outpatient Prescriptions  Medication Sig Dispense Refill  . Cholecalciferol 2000 UNITS CAPS Take 2 capsules (4,000 Units total) by mouth daily.  60 each  3  . citalopram (CELEXA) 20 MG tablet Take 1 tablet (20 mg total) by mouth daily.  30 tablet  6  . pantoprazole (PROTONIX) 40 MG tablet Take 1 tablet (40 mg total) by mouth daily.  30 tablet  3   No current facility-administered medications for this visit.   No Known Allergies History  Substance Use Topics  . Smoking status: Never Smoker   . Smokeless tobacco: Not on file  . Alcohol Use: No   Exam: BP 124/76  Pulse 75  Temp(Src) 97.6 F (36.4 C)  Resp 16  Ht 5\' 7"  (1.702 m)  Wt 143 lb (64.864 kg)  BMI 22.39 kg/m2 General: Alert, well-developed Caucasian male, in no distress Skin: Warm and dry without rash or infection. Lymph nodes: No inguinal nodes palpable. Lungs: Breath sounds clear and equal without increased work of breathing Cardiovascular: Regular rate and rhythm without murmur. No JVD or edema. Peripheral pulses intact. Abdomen: Nondistended. Soft and nontender. No masses palpable. No organomegaly. There is a small to moderate sized reducible left  inguinal hernia, nontender. I cannot feel a hernia on the right. Extremities: No edema or joint swelling or deformity. No chronic venous stasis changes. Neurologic: Alert and fully oriented. Gait normal.  Assessment and plan: Left inguinal hernia. I recommended repair. We discussed options including open and laparoscopic repair. I believe he would be a good candidate for laparoscopic repair which he prefers to do to her return to full activity. Since there's been some concern for hernia on the right we did examine the area laparoscopically as well. However, he is starting a new job this month and feels that he is not able to have the surgery done until approximately 6 months from now. He is not having symptoms and I do not think that would be a problem. He is given literature regarding hernia repair. He understands to call for any definite enlargement or discomfort. Otherwise he will return in 6 months for reevaluation and possible repair.

## 2014-01-07 NOTE — Patient Instructions (Signed)

## 2014-01-12 ENCOUNTER — Ambulatory Visit (INDEPENDENT_AMBULATORY_CARE_PROVIDER_SITE_OTHER): Admitting: Surgery

## 2014-01-27 ENCOUNTER — Ambulatory Visit (INDEPENDENT_AMBULATORY_CARE_PROVIDER_SITE_OTHER): Admitting: Family Medicine

## 2014-01-27 ENCOUNTER — Encounter: Payer: Self-pay | Admitting: Family Medicine

## 2014-01-27 VITALS — BP 102/69 | HR 71 | Temp 97.0°F | Ht 69.0 in | Wt 140.4 lb

## 2014-01-27 DIAGNOSIS — IMO0002 Reserved for concepts with insufficient information to code with codable children: Secondary | ICD-10-CM

## 2014-01-27 DIAGNOSIS — R21 Rash and other nonspecific skin eruption: Secondary | ICD-10-CM

## 2014-01-27 DIAGNOSIS — L03113 Cellulitis of right upper limb: Secondary | ICD-10-CM

## 2014-01-27 MED ORDER — DOXYCYCLINE HYCLATE 100 MG PO TABS: 100.0000 mg | ORAL_TABLET | Freq: Two times a day (BID) | ORAL | Status: DC

## 2014-01-27 MED ORDER — HYDROXYZINE HCL 25 MG PO TABS: 25.0000 mg | ORAL_TABLET | Freq: Three times a day (TID) | ORAL | Status: DC | PRN

## 2014-01-27 MED ORDER — METHYLPREDNISOLONE ACETATE 80 MG/ML IJ SUSP
80.0000 mg | Freq: Once | INTRAMUSCULAR | Status: AC
  Administered 2014-01-27: 80 mg via INTRAMUSCULAR

## 2014-01-27 NOTE — Progress Notes (Signed)
   Subjective:    Patient ID: Antonio [name], male    DOB: [birth year], 28 y.o.   MRN: [id number]  HPI This 28 y.o. male presents for evaluation of rash on right arm.  He was out in the woods over the weekend and he now has a rash on his right arm.   Review of Systems C/o rash No chest pain, SOB, HA, dizziness, vision change, N/V, diarrhea, constipation, dysuria, urinary urgency or frequency, myalgias, arthralgias or rash.     Objective:   Physical Exam Vital signs noted  Well developed well nourished male.  HEENT - Head atraumatic Normocephalic                Eyes - PERRLA, Conjuctiva - clear Sclera- Clear EOMI                Ears - EAC's Wnl TM's Wnl Gross Hearing WNL                 Throat - oropharanx wnl Respiratory - Lungs CTA bilateral Cardiac - RRR S1 and S2 without murmur GI - Abdomen soft Nontender and bowel sounds active x 4 Skin - Erythematous rash and vesicles right arm.       Assessment & Plan:  Cellulitis of right upper extremity - Plan: doxycycline (VIBRA-TABS) 100 MG tablet  Rash and nonspecific skin eruption - Plan: methylPREDNISolone acetate (DEPO-MEDROL) injection 80 mg, hydrOXYzine (ATARAX/VISTARIL) 25 MG tablet  Follow up prn  Lysbeth Penner FNP

## 2014-02-09 ENCOUNTER — Ambulatory Visit: Admitting: Nurse Practitioner

## 2014-02-16 ENCOUNTER — Ambulatory Visit (INDEPENDENT_AMBULATORY_CARE_PROVIDER_SITE_OTHER): Admitting: Nurse Practitioner

## 2014-02-16 ENCOUNTER — Encounter: Payer: Self-pay | Admitting: Nurse Practitioner

## 2014-02-16 VITALS — BP 112/62 | HR 80 | Temp 97.9°F | Ht 69.0 in | Wt 137.0 lb

## 2014-02-16 DIAGNOSIS — E785 Hyperlipidemia, unspecified: Secondary | ICD-10-CM

## 2014-02-16 DIAGNOSIS — E559 Vitamin D deficiency, unspecified: Secondary | ICD-10-CM

## 2014-02-16 DIAGNOSIS — F411 Generalized anxiety disorder: Secondary | ICD-10-CM | POA: Insufficient documentation

## 2014-02-16 DIAGNOSIS — K219 Gastro-esophageal reflux disease without esophagitis: Secondary | ICD-10-CM

## 2014-02-16 MED ORDER — PANTOPRAZOLE SODIUM 40 MG PO TBEC: 40.0000 mg | DELAYED_RELEASE_TABLET | Freq: Every day | ORAL | Status: AC

## 2014-02-16 MED ORDER — CITALOPRAM HYDROBROMIDE 20 MG PO TABS: 20.0000 mg | ORAL_TABLET | Freq: Every day | ORAL | Status: DC

## 2014-02-16 NOTE — Progress Notes (Signed)
Subjective:    Patient ID: Antonio [name], male    DOB: 1986/04/07, 28 y.o.   MRN: [id number]  Patient here today for follow up of chronic medical problems.  Hyperlipidemia This is a chronic problem. The current episode started more than 1 year ago. The problem is uncontrolled. Recent lipid tests were reviewed and are high. He has no history of diabetes, hypothyroidism or obesity. There are no known factors aggravating his hyperlipidemia. He is currently on no antihyperlipidemic treatment. The current treatment provides no improvement of lipids. Compliance problems include adherence to diet and adherence to exercise.   Gastrophageal Reflux This is a chronic problem. The current episode started more than 1 year ago. The problem occurs occasionally. The problem has been gradually improving. The symptoms are aggravated by lying down, caffeine and certain foods. Pertinent negatives include no fatigue, melena, orthopnea or weight loss. There are no known risk factors. The treatment provided moderate relief.  GAD celexa daily- seems to be keeping him calm and also helps him to focus. Vitamin d  def Not taking anything- says that he can not remember to take.    Review of Systems  Constitutional: Negative for weight loss and fatigue.  Respiratory: Negative.   Cardiovascular: Negative.   Gastrointestinal: Negative.  Negative for melena.  Neurological: Negative.   Psychiatric/Behavioral: Negative.   All other systems reviewed and are negative.      Objective:   Physical Exam  Constitutional: He is oriented to person, place, and time. He appears well-developed and well-nourished.  HENT:  Head: Normocephalic.  Right Ear: External ear normal.  Left Ear: External ear normal.  Nose: Nose normal.  Mouth/Throat: Oropharynx is clear and moist.  Eyes: EOM are normal. Pupils are equal, round, and reactive to light.  Neck: Normal range of motion. Neck supple. No JVD present. No thyromegaly  present.  Cardiovascular: Normal rate, regular rhythm, normal heart sounds and intact distal pulses.  Exam reveals no gallop and no friction rub.   No murmur heard. Pulmonary/Chest: Effort normal and breath sounds normal. No respiratory distress. He has no wheezes. He has no rales. He exhibits no tenderness.  Abdominal: Soft. Bowel sounds are normal. He exhibits no mass. There is no tenderness.  Musculoskeletal: Normal range of motion. He exhibits no edema.  Lymphadenopathy:    He has no cervical adenopathy.  Neurological: He is alert and oriented to person, place, and time. No cranial nerve deficit.  Skin: Skin is warm and dry.  Psychiatric: He has a normal mood and affect. His behavior is normal. Judgment and thought content normal.   BP 112/62  Pulse 80  Temp(Src) 97.9 F (36.6 C) (Oral)  Ht '5\' 9"'  (1.753 m)  Wt 137 lb (62.143 kg)  BMI 20.22 kg/m2        Assessment & Plan:   1. HLD (hyperlipidemia)   2. Gastroesophageal reflux disease without esophagitis   3. GAD (generalized anxiety disorder)   4. Vitamin D deficiency    Orders Placed This Encounter  Procedures  . CMP14+EGFR  . NMR, lipoprofile  . Vit D  25 hydroxy (rtn osteoporosis monitoring)   Meds ordered this encounter  Medications  . citalopram (CELEXA) 20 MG tablet    Sig: Take 1 tablet (20 mg total) by mouth daily.    Dispense:  30 tablet    Refill:  6    Order Specific Question:  Supervising Provider    Answer:  Chipper Herb [[id number]]  . pantoprazole (PROTONIX)  40 MG tablet    Sig: Take 1 tablet (40 mg total) by mouth daily.    Dispense:  30 tablet    Refill:  3    Order Specific Question:  Supervising Provider    Answer:  Chipper Herb [[id number]]    Labs pending Health maintenance reviewed Diet and exercise encouraged Continue all meds Follow up  In 6 months   [place], FNP

## 2014-02-16 NOTE — Patient Instructions (Signed)

## 2014-02-17 ENCOUNTER — Encounter: Payer: Self-pay | Admitting: Nurse Practitioner

## 2014-02-17 ENCOUNTER — Telehealth: Payer: Self-pay | Admitting: Family Medicine

## 2014-02-17 LAB — NMR, LIPOPROFILE
Cholesterol: 157 mg/dL (ref 100–199)
HDL Cholesterol by NMR: 42 mg/dL (ref >39)
HDL PARTICLE NUMBER: 30.8 umol/L (ref >=30.5)
LDL PARTICLE NUMBER: 1005 nmol/L — AB (ref <1000)
LDL Size: 20.9 nm (ref >20.5)
LDLC SERPL CALC-MCNC: 92 mg/dL (ref 0–99)
LP-IR Score: 64 — ABNORMAL HIGH (ref <=45)
Small LDL Particle Number: 389 nmol/L (ref <=527)
Triglycerides by NMR: 114 mg/dL (ref 0–149)

## 2014-02-17 LAB — CMP14+EGFR
A/G RATIO: 2.9 — AB (ref 1.1–2.5)
ALT: 12 IU/L (ref 0–44)
AST: 22 IU/L (ref 0–40)
Albumin: 4.9 g/dL (ref 3.5–5.5)
Alkaline Phosphatase: 63 IU/L (ref 39–117)
BILIRUBIN TOTAL: 1.1 mg/dL (ref 0.0–1.2)
BUN/Creatinine Ratio: 13 (ref 8–19)
BUN: 13 mg/dL (ref 6–20)
CALCIUM: 10 mg/dL (ref 8.7–10.2)
CO2: 26 mmol/L (ref 18–29)
CREATININE: 1.01 mg/dL (ref 0.76–1.27)
Chloride: 101 mmol/L (ref 97–108)
GFR calc non Af Amer: 101 mL/min/{1.73_m2} (ref >59)
GFR, EST AFRICAN AMERICAN: 117 mL/min/{1.73_m2} (ref >59)
GLUCOSE: 88 mg/dL (ref 65–99)
Globulin, Total: 1.7 g/dL (ref 1.5–4.5)
Potassium: 3.8 mmol/L (ref 3.5–5.2)
Sodium: 142 mmol/L (ref 134–144)
Total Protein: 6.6 g/dL (ref 6.0–8.5)

## 2014-02-17 LAB — VITAMIN D 25 HYDROXY (VIT D DEFICIENCY, FRACTURES): Vit D, 25-Hydroxy: 24.6 ng/mL — ABNORMAL LOW (ref 30.0–100.0)

## 2014-02-17 NOTE — Telephone Encounter (Signed)
Patient aware.

## 2014-02-17 NOTE — Telephone Encounter (Signed)
Message copied by Waverly Ferrari on Tue Feb 17, 2014 12:29 PM ------      Message from: Chevis Pretty      Created: Tue Feb 17, 2014  9:31 AM       Kidney and liver function stable      Cholesterol looks great      Vita d low- vitamin d 2000 iu OTC daily      Continue current meds- low fat diet and exercise and recheck in 3 months       ------

## 2014-05-20 ENCOUNTER — Ambulatory Visit: Admitting: Nurse Practitioner

## 2014-07-27 ENCOUNTER — Telehealth (INDEPENDENT_AMBULATORY_CARE_PROVIDER_SITE_OTHER): Payer: Self-pay

## 2014-07-27 NOTE — Telephone Encounter (Signed)
Dr. Excell Seltzer, this patient was put on light duty for his job until 08/03/14, however he is in the [employer] and will have to be going out into the field before he is released from light duty. The patient would like to know if he should be out in the field as it is not a "controlled environment." Should we write a note asking that he be excused from this? I told pt I would write you a message and let him know what your thoughts are. The pt verbalized understanding. Please advise.

## 2014-07-27 NOTE — Telephone Encounter (Signed)
If he is off of work and he should also be written on both field duty for the same amount of time.

## 2014-07-31 HISTORY — PX: HERNIA REPAIR: SHX51

## 2014-09-28 ENCOUNTER — Telehealth: Payer: Self-pay | Admitting: Nurse Practitioner

## 2014-09-28 NOTE — Telephone Encounter (Signed)
Stp advised no appts available today, to CB at 7:45 in the morning to see if we had any cancellations. Pt voiced understanding.

## 2015-11-02 DIAGNOSIS — F32A Depression, unspecified: Secondary | ICD-10-CM | POA: Insufficient documentation

## 2015-11-02 DIAGNOSIS — F419 Anxiety disorder, unspecified: Secondary | ICD-10-CM | POA: Insufficient documentation

## 2015-11-02 DIAGNOSIS — E559 Vitamin D deficiency, unspecified: Secondary | ICD-10-CM | POA: Insufficient documentation

## 2015-11-02 HISTORY — DX: Depression, unspecified: F32.A

## 2015-11-02 HISTORY — DX: Anxiety disorder, unspecified: F41.9

## 2015-12-03 ENCOUNTER — Encounter: Payer: Self-pay | Admitting: Pulmonary Disease

## 2015-12-03 ENCOUNTER — Ambulatory Visit (INDEPENDENT_AMBULATORY_CARE_PROVIDER_SITE_OTHER): Admitting: Pulmonary Disease

## 2015-12-03 VITALS — BP 112/60 | HR 71 | Ht 67.0 in | Wt 146.0 lb

## 2015-12-03 DIAGNOSIS — G4700 Insomnia, unspecified: Secondary | ICD-10-CM | POA: Diagnosis not present

## 2015-12-03 DIAGNOSIS — R0683 Snoring: Secondary | ICD-10-CM | POA: Diagnosis not present

## 2015-12-03 DIAGNOSIS — G4726 Circadian rhythm sleep disorder, shift work type: Secondary | ICD-10-CM | POA: Diagnosis not present

## 2015-12-03 NOTE — Patient Instructions (Signed)
Will arrange for home sleep study Will call to arrange for follow up after sleep study reviewed  

## 2015-12-03 NOTE — Progress Notes (Signed)
Past Surgical History He  has past surgical history that includes Mandible fracture surgery; Hernia repair (2016); and Wisdom tooth extraction.  No Known Allergies  Family History His family history is not on file.  Social History He  reports that he has never smoked. He does not have any smokeless tobacco history on file. He reports that he drinks alcohol. He reports that he does not use illicit drugs.  Review of systems See above.  Current Outpatient Prescriptions on File Prior to Visit  Medication Sig  . citalopram (CELEXA) 20 MG tablet Take 1 tablet (20 mg total) by mouth daily.  . pantoprazole (PROTONIX) 40 MG tablet Take 1 tablet (40 mg total) by mouth daily.   No current facility-administered medications on file prior to visit.    Chief Complaint  Patient presents with  . SLEEP CONSULT    Referred by Dr Tollie Pizza. Epworth Score:     Tests:  Past medical history He  has a past medical history of GERD (gastroesophageal reflux disease); Vitamin D deficiency; and HLD (hyperlipidemia).  Vital signs BP 112/60 mmHg  Pulse 71  Ht 5\' 7"  (1.702 m)  Wt 146 lb (66.225 kg)  BMI 22.86 kg/m2  SpO2 98%  History of Present Illness Antonio [name] is a 30 y.o. male for evaluation of sleep problems.  He has noticed trouble with his sleep for at least a year.  He has trouble staying asleep, and feels sleepy all the time.  He snores, and has been told he stops breathing.  He sleeps on his stomach.  He will talk in his sleep.  He goes to sleep at 2 pm.  He falls asleep quickly.  He wakes up 3 to 4 times.  He gets out of bed at 950 pm.  He feels tired after waking up.  He sometimes gets headaches.  He does not use anything to help him fall sleep.  He drinks 2 to 3 Monster energy drinks while at work.  He denies sleep walking, sleep talking, bruxism, or nightmares.  There is no history of restless legs.  He denies sleep hallucinations, sleep paralysis, or cataplexy.  The Epworth score  is 18 out of 24.   Physical Exam:  General - No distress ENT - No sinus tenderness, no oral exudate, no LAN, no thyromegaly, TM clear, pupils equal/reactive, MP 2, over bite, low laying soft palate Cardiac - s1s2 regular, no murmur, pulses symmetric Chest - No wheeze/rales/dullness, good air entry, normal respiratory excursion Back - No focal tenderness Abd - Soft, non-tender, no organomegaly, + bowel sounds Ext - No edema Neuro - Normal strength, cranial nerves intact Skin - No rashes Psych - Normal mood, and behavior  Discussion: He has snoring, sleep disruption, awake time sleepiness, and witnessed apnea.  He has hx of depression/anxiety.  He works 3 rd shift.  He has trouble maintaining sleep.  It is possible he could have obstructive sleep apnea contributing to this.  He has hx of GERD, but this seems to be controlled and doesn't seem to be an issue with his sleep.  Assessment/plan:  Snoring. - will arrange for home sleep study  Shift work sleep schedule. - discussed importance of maintaining regular sleep/wake schedule, and consolidating his sleep - proper sleep hygiene reviewed  Sleep maintenance insomnia. - will address further after review of his home sleep study   Patient Instructions  Will arrange for home sleep study Will call to arrange for follow up after sleep study reviewed  Chesley Mires, M.D. Pager [phone] 12/03/2015, 9:37 AM

## 2015-12-03 NOTE — Progress Notes (Signed)
   Subjective:    Patient ID: Antonio [name], male    DOB: Apr 28, 1986, 30 y.o.   MRN: [id number]  HPI    Review of Systems  Constitutional: Negative for fever and unexpected weight change.  HENT: Negative for congestion, dental problem, ear pain, nosebleeds, postnasal drip, rhinorrhea, sinus pressure, sneezing, sore throat and trouble swallowing.   Eyes: Negative for redness and itching.  Respiratory: Negative for cough, chest tightness, shortness of breath and wheezing.   Cardiovascular: Negative for palpitations and leg swelling.  Gastrointestinal: Negative for nausea and vomiting.  Genitourinary: Negative for dysuria.  Musculoskeletal: Negative for joint swelling.  Skin: Negative for rash.  Neurological: Negative for headaches.  Hematological: Does not bruise/bleed easily.  Psychiatric/Behavioral: Positive for dysphoric mood. The patient is not nervous/anxious.        Objective:   Physical Exam        Assessment & Plan:

## 2015-12-22 DIAGNOSIS — G4733 Obstructive sleep apnea (adult) (pediatric): Secondary | ICD-10-CM | POA: Diagnosis not present

## 2015-12-24 ENCOUNTER — Telehealth: Payer: Self-pay | Admitting: Pulmonary Disease

## 2015-12-24 NOTE — Telephone Encounter (Signed)
HST 12/22/15 >> AHI 11.6, SpO2 82%.   Will have my nurse inform pt that sleep study shows mild sleep apnea.  Please schedule ROV with me or nurse practitioner to review results and discuss treatment options (CPAP versus oral appliance).

## 2015-12-31 NOTE — Telephone Encounter (Signed)
LM x 1 

## 2016-01-03 NOTE — Telephone Encounter (Signed)
lmtcb x1 for pt. 

## 2016-01-03 NOTE — Telephone Encounter (Signed)
Patient returned our call -prm

## 2016-01-04 NOTE — Telephone Encounter (Signed)
Patient called back advised that Dr. Halford Chessman wanted him to have a return appt to discuss sleep study results with him or NP. Scheduled patient with Judson Roch on 01/06/16 @ 9:30am - pt voiced understanding . -prm

## 2016-01-05 ENCOUNTER — Other Ambulatory Visit: Payer: Self-pay | Admitting: *Deleted

## 2016-01-05 DIAGNOSIS — R0683 Snoring: Secondary | ICD-10-CM

## 2016-01-06 ENCOUNTER — Encounter: Payer: Self-pay | Admitting: Acute Care

## 2016-01-06 ENCOUNTER — Telehealth: Payer: Self-pay | Admitting: Acute Care

## 2016-01-06 ENCOUNTER — Ambulatory Visit (INDEPENDENT_AMBULATORY_CARE_PROVIDER_SITE_OTHER): Admitting: Acute Care

## 2016-01-06 VITALS — BP 120/80 | HR 72 | Ht 67.0 in | Wt 145.0 lb

## 2016-01-06 DIAGNOSIS — Z9989 Dependence on other enabling machines and devices: Secondary | ICD-10-CM

## 2016-01-06 DIAGNOSIS — G4733 Obstructive sleep apnea (adult) (pediatric): Secondary | ICD-10-CM

## 2016-01-06 HISTORY — DX: Obstructive sleep apnea (adult) (pediatric): G47.33

## 2016-01-06 NOTE — Progress Notes (Signed)
History of Present Illness Antonio [name] is a 30 y.o. male with moderate OSA followed by Dr. Halford Chessman.   6/8/2017Follow Up OSA: Review Home Sleep Study/ Determine Treatment Options: Patient presents to the office for review of home sleep study. Results of that study indicate mild obstructive sleep apnea with an AHI of 11.6 and SPO2 low of 82%. He originally presented to the office with complaints of snoring, sleep disruption, and daytime sleepiness. We discussed the options of no treatment versus oral appliance, CPAP therapy, or surgery. We also discussed Thera Vent as an option. The patient states he would prefer to start with the CPAP device. He would like to use nasal pillows to start with. We discussed the benefits to his overall health to be obtained by compliance with this treatment. We discussed that the goal was to wear his CPAP each and every night, for the entire time he has a sleep. He works third shift, and usually sleeps from about 2 PM until 10 PM daily. We discussed the importance of maintaining regular sleep/wake schedule to consolidate sleep, and proper sleep hygiene. He verbalized understanding. Scheduled to return to see Dr. Halford Chessman 6 weeks after treatment has been initiated with download to assess for improvement of snoring ,sleep disruption, and witnessed apnea..  Tests: Home sleep study: 12/22/2015 Mild Sleep Apnea  Total apnea events over 3.6 hours = 22 Hypoxemia events for an index of 7.9 hours=48 10 obstructive apneas 11 central apneas 1 mixed apnea AHI= 11.6 per hour of sleep Cheyne-Stokes respirations were not observed Hypoventilation was not observed 41 desaturations occurred during the study Lowest saturation was 91% with an average of 95% Minimum saturation value during sleep was 91% Minimum sat value associated with respiratory event was 82% Average heart rate during sleep was 67.1 bpm  Past medical hx Past Medical History  Diagnosis Date  . GERD  (gastroesophageal reflux disease)   . Vitamin D deficiency   . HLD (hyperlipidemia)      Past surgical hx, Family hx, Social hx all reviewed.  Current Outpatient Prescriptions on File Prior to Visit  Medication Sig  . citalopram (CELEXA) 20 MG tablet Take 1 tablet (20 mg total) by mouth daily.  . pantoprazole (PROTONIX) 40 MG tablet Take 1 tablet (40 mg total) by mouth daily.   No current facility-administered medications on file prior to visit.     No Known Allergies  Review Of Systems:  Constitutional:   No  weight loss, night sweats,  Fevers, chills, +fatigue, or  lassitude.  HEENT:   No headaches,  Difficulty swallowing,  Tooth/dental problems, or  Sore throat,                No sneezing, itching, ear ache, nasal congestion, post nasal drip,   CV:  No chest pain,  Orthopnea, PND, swelling in lower extremities, anasarca, dizziness, palpitations, syncope.   GI  No heartburn, indigestion, abdominal pain, nausea, vomiting, diarrhea, change in bowel habits, loss of appetite, bloody stools.   Resp: No shortness of breath with exertion or at rest.  No excess mucus, no productive cough,  No non-productive cough,  No coughing up of blood.  No change in color of mucus.  No wheezing.  No chest wall deformity,+snoring  Skin: no rash or lesions.  GU: no dysuria, change in color of urine, no urgency or frequency.  No flank pain, no hematuria   MS:  No joint pain or swelling.  No decreased range of motion.  No  back pain.  Psych:  No change in mood or affect. No depression or anxiety.  No memory loss.   Vital Signs BP 120/80 mmHg  Pulse 72  Ht 5\' 7"  (1.702 m)  Wt 145 lb (65.772 kg)  BMI 22.71 kg/m2  SpO2 96%   Physical Exam:  General- No distress,  A&Ox3, thin pleasant male ENT: No sinus tenderness, TM clear, pale nasal mucosa, no oral exudate,no post nasal drip, no LAN Cardiac: S1, S2, regular rate and rhythm, no murmur Chest: No wheeze/ rales/ dullness; no accessory muscle  use, no nasal flaring, no sternal retractions Abd.: Soft Non-tender Ext: No clubbing cyanosis, edema Neuro:  normal strength Skin: No rashes, warm and dry Psych: normal mood and behavior   Assessment/Plan  OSA on CPAP Mild Sleep apnea per home sleep study done 12/22/2015 After speaking with patient he would prefer to start CPAP device over oral appliance or surgery. Plan: We will initiate CPAP therapy We'll start with settings of 5-15 cm water auto set mode. Nasal pillows per patient's preference, as he is a stomach sleeper and feels this would be the best fit for him. Follow-up with Dr. Halford Chessman in 6 weeks with download to assess effectiveness of therapy. Continue to stress importance of maintaining regular sleep-wake schedule to consolidate sleep, and proper sleep hygiene. Discussed need to wear CPAP device entire time he is asleep.      [name], NP 01/06/2016  10:28 AM

## 2016-01-06 NOTE — Patient Instructions (Signed)
We will set you up for a cpap machine. We will send you to our patient care coordinator to arrange getting your machine. Follow up appointment with Dr. Halford Chessman 6 weeks after you start using your device Please contact office for sooner follow up if symptoms do not improve or worsen or seek emergency care

## 2016-01-06 NOTE — Assessment & Plan Note (Addendum)
Mild Sleep apnea per home sleep study done 12/22/2015 After speaking with patient he would prefer to start CPAP device over oral appliance or surgery. Plan: We will initiate CPAP therapy We'll start with settings of 5-15 cm water auto set mode. Nasal pillows per patient's preference, as he is a stomach sleeper and feels this would be the best fit for him. Follow-up with Dr. Halford Chessman in 6 weeks with download to assess effectiveness of therapy. Continue to stress importance of maintaining regular sleep-wake schedule to consolidate sleep, and proper sleep hygiene. Discussed need to wear CPAP device entire time he is asleep.

## 2016-01-06 NOTE — Telephone Encounter (Signed)
Attempted to contact pt. No answer, voicemail has not been set up. Will try back. 

## 2016-01-07 NOTE — Progress Notes (Signed)
Reviewed and agree with assessment/plan.  Chesley Mires, MD [facility] Pulmonary/Critical Care 01/07/2016, 7:27 AM Pager:  [phone]

## 2016-01-10 NOTE — Telephone Encounter (Signed)
Spoke with pt. He was inquiring about his CPAP machine that was ordered. Advised him that it looks like [facility] is processing this order. He should be contacted in the next few days. He verbalized understanding. Nothing further was needed.

## 2016-01-24 DIAGNOSIS — D1800 Hemangioma unspecified site: Secondary | ICD-10-CM | POA: Insufficient documentation

## 2016-01-24 DIAGNOSIS — K4090 Unilateral inguinal hernia, without obstruction or gangrene, not specified as recurrent: Secondary | ICD-10-CM | POA: Insufficient documentation

## 2016-01-24 HISTORY — DX: Hemangioma unspecified site: D18.00

## 2016-02-21 ENCOUNTER — Ambulatory Visit (INDEPENDENT_AMBULATORY_CARE_PROVIDER_SITE_OTHER): Admitting: Pulmonary Disease

## 2016-02-21 ENCOUNTER — Encounter: Payer: Self-pay | Admitting: Pulmonary Disease

## 2016-02-21 VITALS — BP 104/60 | HR 78 | Ht 67.0 in | Wt 143.6 lb

## 2016-02-21 DIAGNOSIS — G4733 Obstructive sleep apnea (adult) (pediatric): Secondary | ICD-10-CM

## 2016-02-21 DIAGNOSIS — Z9989 Dependence on other enabling machines and devices: Principal | ICD-10-CM

## 2016-02-21 NOTE — Patient Instructions (Addendum)
Please bring your CPAP card to our office later this week to review report  Check with your dentist about whether an oral appliance to treat sleep apnea is an option for you  Follow up in 1 year

## 2016-02-21 NOTE — Progress Notes (Signed)
Current Outpatient Prescriptions on File Prior to Visit  Medication Sig  . citalopram (CELEXA) 20 MG tablet Take 1 tablet (20 mg total) by mouth daily.  . pantoprazole (PROTONIX) 40 MG tablet Take 1 tablet (40 mg total) by mouth daily.   No current facility-administered medications on file prior to visit.      Chief Complaint  Patient presents with  . Follow-up    Pt did not bring SD card for download. Wears CPAP nightly. Denies problems with mask. Pt reports mouth dryness. DME: [facility]     Tests HST 12/22/15 >> AHI 11.6, SpO2 82%.  Past medical hx GERD, HLD, Vitamin D deficiency  Past surgical hx, Allergies, Family hx, Social hx all reviewed.  Vital Signs BP 104/60 (BP Location: Left Arm, Cuff Size: Normal)   Pulse 78   Ht 5\' 7"  (1.702 m)   Wt 143 lb 9.6 oz (65.1 kg)   SpO2 98%   BMI 22.49 kg/m   History of Present Illness Antonio [name] is a 30 y.o. male with obstructive sleep apnea.  He has been using CPAP for about 4 hours per night.  He feels this helps some, but is still taking time to get used to wearing.  He is still working 3rd shift, and this is likely long term work schedule.  He is followed by a dentist in [place].  Physical Exam  General - No distress ENT - No sinus tenderness, no oral exudate, no LAN, MP2, over bite, low laying soft palate Cardiac - s1s2 regular, no murmur Chest - No wheeze/rales/dullness Back - No focal tenderness Abd - Soft, non-tender Ext - No edema Neuro - Normal strength Skin - No rashes Psych - normal mood, and behavior   Assessment/Plan  Obstructive sleep apnea. - he reports compliance and improvement with CPAP - will have him drop off his SD card and call him with report information - continue auto CPAP - he will check with his dentist about whether an oral appliance is an option for him  Shift work sleep schedule. - discussed maintaining regular sleep/wake schedule and proper sleep hygiene   Patient Instructions   Please bring your CPAP card to our office later this week to review report  Check with your dentist about whether an oral appliance to treat sleep apnea is an option for you  Follow up in 1 year   Chesley Mires, MD [place] Pager:  [phone] 02/21/2016, 11:28 AM

## 2016-03-09 ENCOUNTER — Telehealth: Payer: Self-pay | Admitting: Pulmonary Disease

## 2016-03-09 NOTE — Telephone Encounter (Signed)
Auto 01/31/16 to 02/29/16 >> used on 26 of 30 nights with average 4 hrs 17 min.  Average AHI 6.3 with median CPAP 7 and 95 th percentile CPAP 9 cm H2O   Will have my nurse inform pt that CPAP report shows good control of sleep apnea.

## 2016-03-10 NOTE — Telephone Encounter (Signed)
LM x 1 

## 2016-03-10 NOTE — Telephone Encounter (Signed)
Patient returned call, CB is [phone].

## 2016-03-10 NOTE — Telephone Encounter (Signed)
Spoke with pt, aware of results/recs.  Nothing further needed.  

## 2016-03-24 ENCOUNTER — Encounter: Payer: Self-pay | Admitting: Pulmonary Disease

## 2016-03-24 ENCOUNTER — Ambulatory Visit (INDEPENDENT_AMBULATORY_CARE_PROVIDER_SITE_OTHER): Admitting: Pulmonary Disease

## 2016-03-24 ENCOUNTER — Other Ambulatory Visit: Payer: Self-pay | Admitting: Cardiology

## 2016-03-24 ENCOUNTER — Ambulatory Visit
Admission: RE | Admit: 2016-03-24 | Discharge: 2016-03-24 | Disposition: A | Discharge status: Home / Self Care | Source: Ambulatory Visit | Attending: Cardiology | Admitting: Cardiology

## 2016-03-24 VITALS — BP 122/60 | HR 78 | Ht 67.0 in | Wt 145.2 lb

## 2016-03-24 DIAGNOSIS — R079 Chest pain, unspecified: Secondary | ICD-10-CM

## 2016-03-24 DIAGNOSIS — G4733 Obstructive sleep apnea (adult) (pediatric): Secondary | ICD-10-CM

## 2016-03-24 DIAGNOSIS — Z9989 Dependence on other enabling machines and devices: Principal | ICD-10-CM

## 2016-03-24 NOTE — Progress Notes (Signed)
Current Outpatient Prescriptions on File Prior to Visit  Medication Sig  . citalopram (CELEXA) 20 MG tablet Take 1 tablet (20 mg total) by mouth daily.  . pantoprazole (PROTONIX) 40 MG tablet Take 1 tablet (40 mg total) by mouth daily.   No current facility-administered medications on file prior to visit.      Chief Complaint  Patient presents with  . Follow-up    Pt reports weaing machine nost nights. Denies problems with mask/pressure. DME: [facility]     Tests HST 12/22/15 >> AHI 11.6, SpO2 82%. Auto 01/31/16 to 02/29/16 >> used on 26 of 30 nights with average 4 hrs 17 min.  Average AHI 6.3 with median CPAP 7 and 95 th percentile CPAP 9 cm H2O  Past medical hx GERD, HLD, Vitamin D deficiency  Past surgical hx, Allergies, Family hx, Social hx all reviewed.  Vital Signs BP 122/60 (BP Location: Left Arm, Cuff Size: Normal)   Pulse 78   Ht 5\' 7"  (1.702 m)   Wt 145 lb 3.2 oz (65.9 kg)   SpO2 98%   BMI 22.74 kg/m   History of Present Illness Antonio [name] is a 30 y.o. male with obstructive sleep apnea.  I saw Antonio [name] in July.  Unfortunately, he was told by his insurance that he need to have repeat assessment at later date to meet their requirements for compliance monitoring.  He uses CPAP regularly, and feels like this helps.  No issues with mask fit.  He asked about alternative therapies to CPAP.  Physical Exam  General - No distress ENT - No sinus tenderness, no oral exudate, no LAN, MP2, over bite, low laying soft palate Cardiac - s1s2 regular, no murmur Chest - No wheeze/rales/dullness Back - No focal tenderness Abd - Soft, non-tender Ext - No edema Neuro - Normal strength Skin - No rashes Psych - normal mood, and behavior   Assessment/Plan  Obstructive sleep apnea. - he reports compliance and improvement with CPAP - continue auto CPAP - he will check with his dentist about whether an oral appliance is an option for him  Shift work sleep schedule. - discussed  maintaining regular sleep/wake schedule and proper sleep hygiene   Patient Instructions  Call if you would like to try an oral appliance to treat sleep apnea  Follow up in 1 year   Chesley Mires, MD [place] Pager:  [phone] 03/24/2016, 11:09 AM

## 2016-03-24 NOTE — Patient Instructions (Signed)
Call if you would like to try an oral appliance to treat sleep apnea  Follow up in 1 year

## 2016-08-21 ENCOUNTER — Telehealth: Payer: Self-pay | Admitting: Pulmonary Disease

## 2016-08-21 NOTE — Telephone Encounter (Signed)
lmomtcb x1 

## 2016-08-22 NOTE — Telephone Encounter (Signed)
lmtcb for pt.  

## 2016-08-22 NOTE — Telephone Encounter (Signed)
Pt retuning call.Antonio [name] ° °

## 2016-08-23 NOTE — Telephone Encounter (Signed)
lmtcb x2 for pt. 

## 2016-08-23 NOTE — Telephone Encounter (Signed)
Spoke with pt, requesting a letter detailing his office visit dates and his sleep apnea- discussing treatments. 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  Pt has an office visit with VS on Friday at pt request, would like to take letter with him at this office visit.  VS please advise on letter.  Thanks!

## 2016-08-25 ENCOUNTER — Ambulatory Visit (INDEPENDENT_AMBULATORY_CARE_PROVIDER_SITE_OTHER): Admitting: Pulmonary Disease

## 2016-08-25 ENCOUNTER — Encounter: Payer: Self-pay | Admitting: Pulmonary Disease

## 2016-08-25 VITALS — BP 102/62 | HR 66 | Ht 67.0 in | Wt 152.6 lb

## 2016-08-25 DIAGNOSIS — G4733 Obstructive sleep apnea (adult) (pediatric): Secondary | ICD-10-CM | POA: Diagnosis not present

## 2016-08-25 NOTE — Patient Instructions (Signed)
Will arrange for referral to Dr. Mark Katz to assess for oral appliance to treat obstructive sleep apnea  Follow up in 6 months 

## 2016-08-25 NOTE — Progress Notes (Signed)
Current Outpatient Prescriptions on File Prior to Visit  Medication Sig  . citalopram (CELEXA) 20 MG tablet Take 1 tablet (20 mg total) by mouth daily.  . pantoprazole (PROTONIX) 40 MG tablet Take 1 tablet (40 mg total) by mouth daily.   No current facility-administered medications on file prior to visit.      Chief Complaint  Patient presents with  . Follow-up    Discuss OSA treatment.      Tests HST 12/22/15 >> AHI 11.6, SpO2 82%. Auto 01/31/16 to 02/29/16 >> used on 26 of 30 nights with average 4 hrs 17 min.  Average AHI 6.3 with median CPAP 7 and 95 th percentile CPAP 9 cm H2O  Past medical hx GERD, HLD, Vitamin D deficiency  Past surgical hx, Allergies, Family hx, Social hx all reviewed.  Vital Signs BP 102/62 (BP Location: Left Arm, Cuff Size: Normal)   Pulse 66   Ht 5\' 7"  (1.702 m)   Wt 152 lb 9.6 oz (69.2 kg)   SpO2 99%   BMI 23.90 kg/m   History of Present Illness Antonio [name] is a 31 y.o. male with obstructive sleep apnea.  He is using CPAP and this helps.  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  He can't take his machine with him.  Physical Exam  General - pleasant ENT - MP 2, over bite Cardiac - regular, no murmur Chest - no wheeze Back - no tenderness Abd - soft, non tender Ext - no edema Neuro - normal strength Skin - no rashes Psych - normal mood   Assessment/Plan  Obstructive sleep apnea. - he would like to change to oral appliance - will refer to Dr. Oneal Grout  Patient Instructions  Will arrange for referral to Dr. Oneal Grout to assess for oral appliance to treat obstructive sleep apnea  Follow up in 6 months   Chesley Mires, MD [facility]/Sleep Pager:  [phone] 08/25/2016, 12:35 PM

## 2016-08-25 NOTE — Telephone Encounter (Signed)
Letter written and given to pt at time of visit on 08/25/16.

## 2016-11-20 ENCOUNTER — Ambulatory Visit: Admitting: Pulmonary Disease

## 2016-11-22 ENCOUNTER — Ambulatory Visit: Admitting: Pulmonary Disease

## 2016-11-24 ENCOUNTER — Ambulatory Visit: Admitting: Pulmonary Disease

## 2016-12-14 ENCOUNTER — Ambulatory Visit (INDEPENDENT_AMBULATORY_CARE_PROVIDER_SITE_OTHER): Admitting: Pulmonary Disease

## 2016-12-14 ENCOUNTER — Encounter: Payer: Self-pay | Admitting: Pulmonary Disease

## 2016-12-14 VITALS — BP 102/60 | HR 72 | Ht 67.0 in | Wt 146.0 lb

## 2016-12-14 DIAGNOSIS — G4733 Obstructive sleep apnea (adult) (pediatric): Secondary | ICD-10-CM | POA: Diagnosis not present

## 2016-12-14 NOTE — Progress Notes (Signed)
Current Outpatient Prescriptions on File Prior to Visit  Medication Sig  . citalopram (CELEXA) 20 MG tablet Take 1 tablet (20 mg total) by mouth daily.  . pantoprazole (PROTONIX) 40 MG tablet Take 1 tablet (40 mg total) by mouth daily.   No current facility-administered medications on file prior to visit.      Chief Complaint  Patient presents with  . Follow-up    Pt states that he has been using his CPAP machine. Denies problems with mask/pressure setting of CPAP. States that he went to his appt with Dr Ron Parker but never got a call back to be fitted for a Dental Appliance. Pt states that he never followed up on his end either. Pt recently started taking Wellbutrin x 2 months ago.     Sleep tests HST 12/22/15 >> AHI 11.6, SpO2 82%. Auto CPAP 11/14/16 to 12/13/16 >> used on 24 of 30 nights with average 4 hrs 33 min.    Past medical history GERD, HLD, Vitamin D deficiency  Past surgical history, Family history, Social history, Allergies reviewed  Vital Signs BP 102/60 (BP Location: Left Arm, Cuff Size: Normal)   Pulse 72   Ht 5\' 7"  (1.702 m)   Wt 146 lb (66.2 kg)   SpO2 97%   BMI 22.87 kg/m   History of Present Illness CLEVLAND CORK is a 31 y.o. male with obstructive sleep apnea.  He saw Dr. Ron Parker for initial set up, but never heard anything back.  He has been using CPAP and this works.  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  He doesn't know exactly when or where he will be going.  He also doesn't know how long he will be deployed.    Physical Exam  General - pleasant Eyes - pupils reactive ENT - no sinus tenderness, no oral exudate, no LAN, MP 2, over bite Cardiac - regular, no murmur Chest - no wheeze, rales Abd - soft, non tender Ext - no edema Skin - no rashes Neuro - normal strength Psych - normal mood    Assessment/Plan  Obstructive sleep apnea. 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 Ron Parker to assess for oral appliance   Patient Instructions  Will arrange for referral to Dr. Oneal Grout to assess for oral appliance to treat obstructive sleep apnea  Follow up in 6 months   Chesley Mires, MD [facility]/Critical Care/Sleep Pager:  [phone] 12/14/2016, 12:02 PM

## 2016-12-14 NOTE — Patient Instructions (Signed)
Will arrange for referral to Dr. Mark Katz to assess for oral appliance to treat obstructive sleep apnea  Follow up in 6 months 

## 2017-01-06 ENCOUNTER — Emergency Department (HOSPITAL_COMMUNITY)

## 2017-01-06 ENCOUNTER — Emergency Department (HOSPITAL_COMMUNITY)
Admission: EM | Admit: 2017-01-06 | Discharge: 2017-01-06 | Disposition: A | Discharge status: Home / Self Care | Attending: Emergency Medicine | Admitting: Emergency Medicine

## 2017-01-06 ENCOUNTER — Encounter (HOSPITAL_COMMUNITY): Payer: Self-pay | Admitting: *Deleted

## 2017-01-06 DIAGNOSIS — Y999 Unspecified external cause status: Secondary | ICD-10-CM | POA: Diagnosis not present

## 2017-01-06 DIAGNOSIS — S67195A Crushing injury of left ring finger, initial encounter: Secondary | ICD-10-CM | POA: Insufficient documentation

## 2017-01-06 DIAGNOSIS — W208XXA Other cause of strike by thrown, projected or falling object, initial encounter: Secondary | ICD-10-CM | POA: Diagnosis not present

## 2017-01-06 DIAGNOSIS — S6710XA Crushing injury of unspecified finger(s), initial encounter: Secondary | ICD-10-CM

## 2017-01-06 DIAGNOSIS — S6992XA Unspecified injury of left wrist, hand and finger(s), initial encounter: Secondary | ICD-10-CM | POA: Diagnosis present

## 2017-01-06 DIAGNOSIS — Y929 Unspecified place or not applicable: Secondary | ICD-10-CM | POA: Insufficient documentation

## 2017-01-06 DIAGNOSIS — S61215A Laceration without foreign body of left ring finger without damage to nail, initial encounter: Secondary | ICD-10-CM | POA: Insufficient documentation

## 2017-01-06 DIAGNOSIS — Y939 Activity, unspecified: Secondary | ICD-10-CM | POA: Insufficient documentation

## 2017-01-06 MED ORDER — OXYCODONE-ACETAMINOPHEN 5-325 MG PO TABS
1.0000 | ORAL_TABLET | Freq: Once | ORAL | Status: AC
  Administered 2017-01-06: 1 via ORAL
  Filled 2017-01-06: qty 1

## 2017-01-06 NOTE — ED Notes (Signed)
Finger socking in H2O2, and NS

## 2017-01-06 NOTE — ED Notes (Signed)
Declined W/C at D/C and was escorted to lobby by RN. 

## 2017-01-06 NOTE — ED Triage Notes (Signed)
Pt reports having a metal bar fall on his hand, landed on left ring finger, has pain and laceration to fingertip. Tetanus current. Bandage applied at triage, minimal bleeding noted.

## 2017-01-06 NOTE — Discharge Instructions (Signed)
Use splint and dress area as directed. Follow-up with hand specialist for further evaluation. Return to ED for worsening pain, temperature change of area, fever, bleeding, drainage or additional injury.

## 2017-01-06 NOTE — ED Provider Notes (Signed)
[facility] DEPT Provider Note   CSN: [id number] Arrival date & time: 01/06/17  1532  By signing my name below, I, Antonio [name], attest that this documentation has been prepared under the direction and in the presence of Elide Stalzer, PA-C. Electronically Signed: Dora [name], Scribe. 01/06/2017. 5:16 PM.  History   Chief Complaint Chief Complaint  Patient presents with  . Finger Injury   The history is provided by the patient. No language interpreter was used.    HPI Comments: Antonio [name] is a 31 y.o. male who presents to the Emergency Department for evaluation of a left ring finger injury sustained this afternoon. He states a heavy metal tow bar fell onto the ventral aspect of his left ring finger. Patient reports pain to the digit as well as a small laceration at the fingertip that is hemostatic. Patient reports pain exacerbation with bending his left ring finger. No prior h/o injuries to his left ring finger. His tetanus status is UTD. NKDA. Patient denies numbness/tingling, nausea, vomiting, LOC, head injury, loss of sensation, pulsatile bleeding, additional injuries, or any other associated symptoms.  Past Medical History:  Diagnosis Date  . GERD (gastroesophageal reflux disease)   . HLD (hyperlipidemia)   . Vitamin D deficiency     Patient Active Problem List   Diagnosis Date Noted  . OSA on CPAP 01/06/2016  . GAD (generalized anxiety disorder) 02/16/2014  . Family history of hemochromatosis 09/08/2013  . GERD (gastroesophageal reflux disease)   . Vitamin D deficiency   . HLD (hyperlipidemia)     Past Surgical History:  Procedure Laterality Date  . HERNIA REPAIR  2016  . MANDIBLE FRACTURE SURGERY    . WISDOM TOOTH EXTRACTION        Home Medications    Prior to Admission medications   Medication Sig Start Date End Date Taking? Authorizing Provider  buPROPion (WELLBUTRIN XL) 150 MG 24 hr tablet Take 300 mg by mouth every morning. 12/05/16   [provider]  citalopram (CELEXA) 20 MG tablet Take 1 tablet (20 mg total) by mouth daily. 02/16/14   Hassell Done, Mary-Margaret, FNP  pantoprazole (PROTONIX) 40 MG tablet Take 1 tablet (40 mg total) by mouth daily. 02/16/14   Chevis Pretty, [place]    Family History History reviewed. No pertinent family history.  Social History Social History  Substance Use Topics  . Smoking status: Never Smoker  . Smokeless tobacco: Never Used  . Alcohol use 0.0 oz/week     Comment: 3-4 beers daily     Allergies   Patient has no known allergies.   Review of Systems Review of Systems  Constitutional: Negative for appetite change and fever.  Gastrointestinal: Negative for nausea and vomiting.  Musculoskeletal: Positive for arthralgias.  Skin: Positive for wound.  Neurological: Negative for numbness.  Hematological: Does not bruise/bleed easily.   Physical Exam Updated Vital Signs BP 107/83 (BP Location: Right Arm)   Pulse 81   Temp 97.8 F (36.6 C) (Oral)   Resp 16   SpO2 98%   Physical Exam  Constitutional: He appears well-developed and well-nourished. No distress.  HENT:  Head: Normocephalic and atraumatic.  Eyes: Conjunctivae and EOM are normal. No scleral icterus.  Neck: Normal range of motion.  Pulmonary/Chest: Effort normal. No respiratory distress.  Musculoskeletal: He exhibits tenderness.  Full active ROM of the finger, with some pain with flexion. Full ROM of other digits and wrist.  Neurological: He is alert.  Skin: No rash noted. He is not  diaphoretic. There is erythema.  Small area of skin avulsion on the 4th digit of the L hand. No deep lacerations. Normal sensation of all digits.  Psychiatric: He has a normal mood and affect.  Nursing note and vitals reviewed.     ED Treatments / Results  Labs (all labs ordered are listed, but only abnormal results are displayed) Labs Reviewed - No data to display  EKG  EKG Interpretation None       Radiology Dg  Finger Ring Left  Result Date: 01/06/2017 CLINICAL DATA:  Left ring finger pain after dropping a heavy weight on the finger this morning. EXAM: LEFT RING FINGER 2+V COMPARISON:  None. FINDINGS: Mildly comminuted fracture of the fourth distal tuft without significant displacement. There is also a small metallic foreign body in the ventral, radial aspect of the finger at the level of the distal aspect of the fourth proximal phalanx. IMPRESSION: Comminuted fracture of the fourth distal tuft and small metallic foreign body. Electronically Signed   By: Claudie Revering M.D.   On: 01/06/2017 16:27    Procedures Procedures (including critical care time)  DIAGNOSTIC STUDIES: Oxygen Saturation is 99% on RA, normal by my interpretation.    COORDINATION OF CARE: 5:14 PM Discussed treatment plan with pt at bedside and pt agreed to plan.  Medications Ordered in ED Medications  oxyCODONE-acetaminophen (PERCOCET/ROXICET) 5-325 MG per tablet 1 tablet (1 tablet Oral Given 01/06/17 1729)     Initial Impression / Assessment and Plan / ED Course  I have reviewed the triage vital signs and the nursing notes.  Pertinent labs & imaging results that were available during my care of the patient were reviewed by me and considered in my medical decision making (see chart for details).   Patient presents to ED for c/o finger injury that occurred PTA. He reports pain with movement of finger that has been alleviated with oral pain medication here in the ED. Physical exam shows small area of skin avulsion on the palmar side of the L 4th digit. There are no deeper lacerations that warrant repair. No suturing needed at this time considering the superficial avulsion. Xray showed tuft fracture. This fracture is closed. No signs of neurovascular compromise, as patient has normal sensation present in all digits. Will dress and splint the finger and advise him to follow up with hand specialist for further evaluation as needed. Strict  return precautions given.  Final Clinical Impressions(s) / ED Diagnoses   Final diagnoses:  Crushing injury of finger, initial encounter  Laceration of left ring finger without damage to nail, foreign body presence unspecified, initial encounter    New Prescriptions Discharge Medication List as of 01/06/2017  5:47 PM     I personally performed the services described in this documentation, which was scribed in my presence. The recorded information has been reviewed and is accurate.    Delia Heady, PA-C 01/07/17 1322    Fatima Blank, MD 01/07/17 [phone]

## 2017-01-15 DIAGNOSIS — S62665A Nondisplaced fracture of distal phalanx of left ring finger, initial encounter for closed fracture: Secondary | ICD-10-CM | POA: Insufficient documentation

## 2017-03-26 ENCOUNTER — Telehealth: Payer: Self-pay | Admitting: Pulmonary Disease

## 2017-03-26 NOTE — Telephone Encounter (Signed)
lmtcb for pt.  

## 2017-03-27 NOTE — Telephone Encounter (Signed)
lmomtcb x 2 for the pt.  

## 2017-03-28 NOTE — Telephone Encounter (Signed)
Called and spoke with pt and he stated that he will need a letter stating that the mouth piece will suffice if he does not use the cpap.  He stated that the [employer] is worried about him using the cpap----he will still use the cpap when he is at home but when he has to leave for the [employer].  VS please advise if you are willing to write this letter for the pt.  Thanks  No Known Allergies

## 2017-03-29 ENCOUNTER — Encounter: Payer: Self-pay | Admitting: Pulmonary Disease

## 2017-03-29 NOTE — Telephone Encounter (Signed)
Letter printed and signed.  It is in my office. 

## 2017-03-29 NOTE — Telephone Encounter (Signed)
Spoke with pt, requesting letter be mailed to him- verified home address.  This has been placed in outgoing mail.  Nothing further needed.

## 2017-03-29 NOTE — Telephone Encounter (Signed)
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  Pt stated he has this device and this can be confirmed by Dr. Ron Parker if needed.  Pt is requesting a letter from [facility] to give to his Unit stating that he is "fully capable of deploying or going to the field" with the oral appliance.  Pt needs this letter by September 9th  Dr Halford Chessman please advise if okay to write letter, thank you.  Per the 5.17.18 ov w/ VS: Assessment/Plan Obstructive sleep apnea. 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 Ron Parker to assess for oral appliance     Patient Instructions  Will arrange for referral to Dr. Oneal Grout to assess for oral appliance to treat obstructive sleep apnea   Follow up in 6 months

## 2017-04-16 ENCOUNTER — Telehealth: Payer: Self-pay | Admitting: Pulmonary Disease

## 2017-04-16 NOTE — Telephone Encounter (Signed)
Called pt but no answer. Left a message to call back. I placed forms in VS look at folder.

## 2017-04-18 NOTE — Telephone Encounter (Signed)
Spoke with patient. He is aware that the forms have been filled out and left up front. Patient stated that he will come by either today or tomorrow to pick up forms.   Nothing else was needed at time of call.

## 2017-04-18 NOTE — Telephone Encounter (Signed)
Pt returning call.  [phone] tr

## 2017-04-18 NOTE — Telephone Encounter (Signed)
Form signed and in my office. 

## 2017-06-18 ENCOUNTER — Ambulatory Visit (INDEPENDENT_AMBULATORY_CARE_PROVIDER_SITE_OTHER): Admitting: Pulmonary Disease

## 2017-06-18 ENCOUNTER — Encounter: Payer: Self-pay | Admitting: Pulmonary Disease

## 2017-06-18 VITALS — BP 124/64 | HR 67 | Ht 67.0 in | Wt 155.4 lb

## 2017-06-18 DIAGNOSIS — G4733 Obstructive sleep apnea (adult) (pediatric): Secondary | ICD-10-CM

## 2017-06-18 DIAGNOSIS — G4726 Circadian rhythm sleep disorder, shift work type: Secondary | ICD-10-CM

## 2017-06-18 NOTE — Patient Instructions (Signed)
Will arrange for in lab sleep study with you using your oral appliance  Follow up in 1 year

## 2017-06-18 NOTE — Progress Notes (Signed)
Current Outpatient Medications on File Prior to Visit  Medication Sig  . aripiprazole (ABILIFY) 10 MG disintegrating tablet Take 10 mg daily by mouth.  Marland Kitchen buPROPion (WELLBUTRIN XL) 150 MG 24 hr tablet Take 300 mg by mouth every morning.  . citalopram (CELEXA) 20 MG tablet Take 1 tablet (20 mg total) by mouth daily.  . pantoprazole (PROTONIX) 40 MG tablet Take 1 tablet (40 mg total) by mouth daily.   No current facility-administered medications on file prior to visit.      Chief Complaint  Patient presents with  . Follow-up    pt is struggling to sleep each night, avg about 4hrs each night of sleep. Pt is using mouth appliance since last visit.     Sleep tests HST 12/22/15 >> AHI 11.6, SpO2 82%. Auto CPAP 11/14/16 to 12/13/16 >> used on 24 of 30 nights with average 4 hrs 33 min.    Past medical history GERD, HLD, Vitamin D deficiency  Past surgical history, Family history, Social history, Allergies reviewed  Vital Signs BP 124/64 (BP Location: Left Arm, Cuff Size: Normal)   Pulse 67   Ht 5\' 7"  (1.702 m)   Wt 155 lb 6.4 oz (70.5 kg)   SpO2 99%   BMI 24.34 kg/m   History of Present Illness Antonio [name] is a 31 y.o. male with obstructive sleep apnea.  He has been using oral appliance.  This works well.  No issues with jaw pain or chewing.  He gets about 4 to 6 hours sleep.  He works third shift.  He goes to bed at 2 am.  Dog wakes him up at 8 am.  He naps again before going to work.  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  He was told he needed an in lab sleep study to document efficacy of oral appliance.  Physical Exam  General - pleasant Eyes - pupils reactive ENT - no sinus tenderness, no oral exudate, no LAN, MP 2, overbite Cardiac - regular, no murmur Chest - no wheeze, rales Abd - soft, non tender Ext - no edema Skin - no rashes Neuro - normal strength Psych - normal mood  Assessment/Plan  Obstructive sleep apnea. - continue oral  appliance - will arrange for in lab sleep study with him using oral appliance to document efficacy of therapy  Shift work. - discussed maintaining a regular sleep schedule, consolidating sleep, and strategically scheduling naps   Patient Instructions  Will arrange for in lab sleep study with you using your oral appliance  Follow up in 1 year   Chesley Mires, MD [place] Pager:  [phone] 06/18/2017, 11:21 AM

## 2017-07-22 ENCOUNTER — Ambulatory Visit (HOSPITAL_BASED_OUTPATIENT_CLINIC_OR_DEPARTMENT_OTHER): Attending: Pulmonary Disease | Admitting: Pulmonary Disease

## 2017-07-22 VITALS — Ht 67.0 in | Wt 160.0 lb

## 2017-07-22 DIAGNOSIS — R0683 Snoring: Secondary | ICD-10-CM | POA: Insufficient documentation

## 2017-07-22 DIAGNOSIS — G4730 Sleep apnea, unspecified: Secondary | ICD-10-CM | POA: Diagnosis present

## 2017-07-22 DIAGNOSIS — G4733 Obstructive sleep apnea (adult) (pediatric): Secondary | ICD-10-CM

## 2017-08-07 NOTE — Procedures (Signed)
    Patient Name: Antonio, [name] Date: 07/22/2017   Gender: Male  D.O.B: 06/29/1986  Age (years): 31  Referring Provider: Chesley Mires MD, ABSM  Height (inches): 55  Interpreting Physician: Chesley Mires MD, ABSM  Weight (lbs): 160  RPSGT: Madelon Lips  BMI: 25  MRN: [id number]  Neck Size: 15.00   CLINICAL INFORMATION  Sleep Study Type: NPSG Indication for sleep study: History of sleep apnea treated with oral appliance. He presents to have sleep study whild using oral appliance to document efficacy of therapy. Epworth Sleepiness Score: 14  SLEEP STUDY TECHNIQUE  As per the AASM Manual for the Scoring of Sleep and Associated Events v2.3 (April 2016) with a hypopnea requiring 4% desaturations. The channels recorded and monitored were frontal, central and occipital EEG, electrooculogram (EOG), submentalis EMG (chin), nasal and oral airflow, thoracic and abdominal wall motion, anterior tibialis EMG, snore microphone, electrocardiogram, and pulse oximetry.  MEDICATIONS  Medications self-administered by patient taken the night of the study : N/A  SLEEP ARCHITECTURE  The study was initiated at 11:01:39 PM and ended at 5:06:14 AM. Sleep onset time was 1.6 minutes and the sleep efficiency was 83.0%. The total sleep time was 302.5 minutes. Stage REM latency was 267.0 minutes. The patient spent 15.04% of the night in stage N1 sleep, 69.75% in stage N2 sleep, 4.63% in stage N3 and 10.58% in REM. Alpha intrusion was absent. Supine sleep was 25.95%.  RESPIRATORY PARAMETERS  The overall apnea/hypopnea index (AHI) was 3.0 per hour. There were 0 total apneas, including 0 obstructive, 0 central and 0 mixed apneas. There were 15 hypopneas and 6 RERAs. The AHI during Stage REM sleep was 0.0 per hour. AHI while supine was 3.1 per hour. The mean oxygen saturation was 95.59%. The minimum SpO2 during sleep was 92.00%. soft snoring was noted during this study.  CARDIAC DATA  The 2 lead EKG  demonstrated sinus rhythm. The mean heart rate was 65.16 beats per minute. Other EKG findings include: None.   LEG MOVEMENT DATA  The total PLMS were 0 with a resulting PLMS index of 0.00. Associated arousal with leg movement index was 0.0 .  IMPRESSIONS  - Good control of sleep apnea with use of oral appliance.  DIAGNOSIS  - Obstructive sleep apnea (G47.33)  RECOMMENDATIONS  - Continue use of oral appliance.  [Electronically signed] 08/10/2017 04:28 AM Chesley Mires MD, [place], American Board of Sleep Medicine  NPI: [id number]

## 2017-08-10 ENCOUNTER — Telehealth: Payer: Self-pay | Admitting: Pulmonary Disease

## 2017-08-10 DIAGNOSIS — G4733 Obstructive sleep apnea (adult) (pediatric): Secondary | ICD-10-CM

## 2017-08-10 NOTE — Telephone Encounter (Signed)
PSG with oral appliance 07/22/17 >> AHI 3, SpO2 low 92%   Please let him know that the sleep study showed good control of sleep apnea while using oral appliance.

## 2017-08-10 NOTE — Telephone Encounter (Signed)
Left voice mail on machine for patient to return phone call back regarding results. X1  

## 2017-08-13 NOTE — Telephone Encounter (Signed)
Advised pt of results. He would like his sleep study mailed to his address. I will mail to patient. Nothing further is needed.

## 2017-09-10 ENCOUNTER — Telehealth: Payer: Self-pay | Admitting: Pulmonary Disease

## 2017-09-10 NOTE — Telephone Encounter (Signed)
Routing this to [facility] for her to follow up on for pt.

## 2017-09-10 NOTE — Telephone Encounter (Signed)
Called and spoke with pt who states he has a document in hand that he needs to have filled out by [facility] stating what type of sleep apnea he has.  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  Pt states he has currently been using a mouth piece and needs documentation stating that he does not need to use CPAP.  Pt needs to know what type of sleep apnea he has (if it is mild or what not).  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  Told pt when he comes to tell people at front desk that he has a document he is dropping off to have VS fill out for him and they would give it to the proper person.  Pt expressed understanding. Nothing further needed at this current time.

## 2017-09-11 NOTE — Telephone Encounter (Signed)
Paperwork was rec'd and in VS green folder to do Will f/u on 09/12/17

## 2017-09-13 NOTE — Telephone Encounter (Signed)
Received form from [facility]. I have printed out the two copies of sleep studies needed. Need to know if he wants to come by to pick these up or do they need to be faxed somewhere (form does not have a fax number on it).

## 2017-09-13 NOTE — Telephone Encounter (Signed)
Kelli please advise with update. Thanks.

## 2017-09-13 NOTE — Telephone Encounter (Signed)
Patient calling for update on status of form, CB is [phone]

## 2017-09-14 NOTE — Telephone Encounter (Signed)
Pt returning call. Per pt he would like to pick up these forms on 2/18. Cb is [phone].

## 2017-09-14 NOTE — Telephone Encounter (Signed)
Form and SSs have been placed up front. Advised patient that they have been placed in a folder due to multiple papers. Copy of form has been placed in scan for VS.   Folder has been placed in brown accordion folder. Left message for patient.

## 2018-04-02 ENCOUNTER — Ambulatory Visit: Payer: Self-pay | Admitting: Pulmonary Disease

## 2018-04-07 ENCOUNTER — Emergency Department (HOSPITAL_COMMUNITY)

## 2018-04-07 ENCOUNTER — Encounter (HOSPITAL_COMMUNITY): Payer: Self-pay | Admitting: *Deleted

## 2018-04-07 ENCOUNTER — Other Ambulatory Visit: Payer: Self-pay

## 2018-04-07 ENCOUNTER — Emergency Department (HOSPITAL_COMMUNITY)
Admission: EM | Admit: 2018-04-07 | Discharge: 2018-04-07 | Disposition: A | Discharge status: Home / Self Care | Attending: Emergency Medicine | Admitting: Emergency Medicine

## 2018-04-07 DIAGNOSIS — Y999 Unspecified external cause status: Secondary | ICD-10-CM | POA: Insufficient documentation

## 2018-04-07 DIAGNOSIS — Y9383 Activity, rough housing and horseplay: Secondary | ICD-10-CM | POA: Insufficient documentation

## 2018-04-07 DIAGNOSIS — S20212A Contusion of left front wall of thorax, initial encounter: Secondary | ICD-10-CM

## 2018-04-07 DIAGNOSIS — Y929 Unspecified place or not applicable: Secondary | ICD-10-CM | POA: Diagnosis not present

## 2018-04-07 DIAGNOSIS — Z79899 Other long term (current) drug therapy: Secondary | ICD-10-CM | POA: Diagnosis not present

## 2018-04-07 DIAGNOSIS — W500XXA Accidental hit or strike by another person, initial encounter: Secondary | ICD-10-CM | POA: Diagnosis not present

## 2018-04-07 DIAGNOSIS — S299XXA Unspecified injury of thorax, initial encounter: Secondary | ICD-10-CM | POA: Diagnosis present

## 2018-04-07 MED ORDER — KETOROLAC TROMETHAMINE 30 MG/ML IJ SOLN
30.0000 mg | Freq: Once | INTRAMUSCULAR | Status: AC
  Administered 2018-04-07: 30 mg via INTRAMUSCULAR
  Filled 2018-04-07: qty 1

## 2018-04-07 MED ORDER — OXYCODONE-ACETAMINOPHEN 5-325 MG PO TABS: 1.0000 | ORAL_TABLET | Freq: Three times a day (TID) | ORAL | 0 refills | Status: DC | PRN

## 2018-04-07 NOTE — ED Provider Notes (Signed)
[facility] DEPT Provider Note   CSN: [id number] Arrival date & time: 04/07/18  0251     History   Chief Complaint Chief Complaint  Patient presents with  . Rib Injury    HPI Antonio [name] is a 32 y.o. male with a past medical history of GERD, who presents to ED for evaluation of left lower rib pain since yesterday.  States that his nephew jumped on his chest and he has had pain since.  He did not have pain prior to the incident.  He has not taken any medicine help with his pain.  States that pain is worse with palpation.  Describes it as sharp and rates it at a 7/10.  Denies any cough, shortness of breath, history of asthma, prior MI or PE, fever.  HPI  Past Medical History:  Diagnosis Date  . GERD (gastroesophageal reflux disease)   . HLD (hyperlipidemia)   . Vitamin D deficiency     Patient Active Problem List   Diagnosis Date Noted  . OSA on CPAP 01/06/2016  . GAD (generalized anxiety disorder) 02/16/2014  . Family history of hemochromatosis 09/08/2013  . GERD (gastroesophageal reflux disease)   . Vitamin D deficiency   . HLD (hyperlipidemia)     Past Surgical History:  Procedure Laterality Date  . HERNIA REPAIR  2016  . MANDIBLE FRACTURE SURGERY    . WISDOM TOOTH EXTRACTION          Home Medications    Prior to Admission medications   Medication Sig Start Date End Date Taking? Authorizing Provider  aripiprazole (ABILIFY) 10 MG disintegrating tablet Take 10 mg daily by mouth.    [provider]  buPROPion (WELLBUTRIN XL) 150 MG 24 hr tablet Take 300 mg by mouth every morning. 12/05/16   [provider]  citalopram (CELEXA) 20 MG tablet Take 1 tablet (20 mg total) by mouth daily. 02/16/14   Hassell Done Mary-Margaret, FNP  oxyCODONE-acetaminophen (PERCOCET/ROXICET) 5-325 MG tablet Take 1 tablet by mouth every 8 (eight) hours as needed for severe pain. 04/07/18   Deago Burruss, PA-C  pantoprazole (PROTONIX) 40 MG tablet  Take 1 tablet (40 mg total) by mouth daily. 02/16/14   Chevis Pretty, FNP    Family History No family history on file.  Social History Social History   Tobacco Use  . Smoking status: Never Smoker  . Smokeless tobacco: Never Used  Substance Use Topics  . Alcohol use: Yes    Alcohol/week: 0.0 standard drinks    Comment: 3-4 beers daily  . Drug use: No     Allergies   Patient has no known allergies.   Review of Systems Review of Systems  Constitutional: Negative for chills and fever.  Gastrointestinal: Negative for nausea.  Musculoskeletal: Positive for arthralgias.  Skin: Negative for wound.     Physical Exam Updated Vital Signs BP 123/81   Pulse 73   Temp 98.1 F (36.7 C) (Oral)   Resp 16   Ht 5\' 7"  (1.702 m)   Wt 68 kg   SpO2 100%   BMI 23.49 kg/m   Physical Exam  Constitutional: He appears well-developed and well-nourished. No distress.  HENT:  Head: Normocephalic and atraumatic.  Eyes: Conjunctivae and EOM are normal. No scleral icterus.  Neck: Normal range of motion.  Cardiovascular: Normal rate, regular rhythm and normal heart sounds.  Pulmonary/Chest: Effort normal and breath sounds normal. No respiratory distress. He exhibits tenderness and bony tenderness.    Neurological: He  is alert.  Skin: No rash noted. He is not diaphoretic.  Psychiatric: He has a normal mood and affect.  Nursing note and vitals reviewed.    ED Treatments / Results  Labs (all labs ordered are listed, but only abnormal results are displayed) Labs Reviewed - No data to display  EKG None  Radiology Dg Ribs Unilateral W/chest Left  Result Date: 04/07/2018 CLINICAL DATA:  Pain in the left ribs after injury yesterday. EXAM: LEFT RIBS AND CHEST - 3+ VIEW COMPARISON:  Chest 03/24/2016 FINDINGS: Normal heart size and pulmonary vascularity. No focal airspace disease or consolidation in the lungs. No blunting of costophrenic angles. No pneumothorax. Mediastinal contours  appear intact. Left ribs appear intact. No acute displaced fractures identified. No focal bone lesion or bone destruction. Soft tissues are unremarkable. IMPRESSION: No evidence of active pulmonary disease.  Negative left ribs. Electronically Signed   By: Lucienne Capers M.D.   On: 04/07/2018 03:13    Procedures Procedures (including critical care time)  Medications Ordered in ED Medications  ketorolac (TORADOL) 30 MG/ML injection 30 mg (30 mg Intramuscular Given 04/07/18 0409)     Initial Impression / Assessment and Plan / ED Course  I have reviewed the triage vital signs and the nursing notes.  Pertinent labs & imaging results that were available during my care of the patient were reviewed by me and considered in my medical decision making (see chart for details).     32 year old male presents to ED for evaluation of left lower rib pain yesterday.  States that his young nephew jumped on his chest.  He did not have pain prior to the incident.  He reports pain worse with palpation.  On exam there is tenderness palpation of the left lower ribs.  X-rays negative for fracture.  Suspect that symptoms are due to a contusion.  Will treat with short course of pain medication and incentive spirometry.  I doubt cardiac or pulmonary cause of symptoms based on patient's risk factors and presentation, reassuring vital signs.  Will advise him to return to ED for any severe worsening symptoms. [place] PMP reviewed with no discrepancies.  Portions of this note were generated with [employer]. Dictation errors may occur despite best attempts at proofreading.   Final Clinical Impressions(s) / ED Diagnoses   Final diagnoses:  Contusion of rib on left side, initial encounter    ED Discharge Orders         Ordered    oxyCODONE-acetaminophen (PERCOCET/ROXICET) 5-325 MG tablet  Every 8 hours PRN     04/07/18 0411           Delia Heady, PA-C 04/07/18 Underwood, Ankit,  MD 04/07/18 [phone]

## 2018-04-07 NOTE — Discharge Instructions (Signed)
Return to ED for worsening symptoms, trouble breathing or trouble swallowing, coughing up blood, leg swelling.

## 2018-04-07 NOTE — ED Triage Notes (Signed)
Pt says his nephew jumped on his left chest yesterday. C/o pain in the left ribs.

## 2018-04-24 DIAGNOSIS — G8929 Other chronic pain: Secondary | ICD-10-CM | POA: Insufficient documentation

## 2018-04-24 DIAGNOSIS — M25572 Pain in left ankle and joints of left foot: Secondary | ICD-10-CM | POA: Insufficient documentation

## 2018-04-24 DIAGNOSIS — S93402A Sprain of unspecified ligament of left ankle, initial encounter: Secondary | ICD-10-CM | POA: Insufficient documentation

## 2018-04-24 HISTORY — DX: Other chronic pain: G89.29

## 2018-05-06 ENCOUNTER — Ambulatory Visit: Payer: Self-pay | Admitting: Pulmonary Disease

## 2018-05-07 ENCOUNTER — Encounter: Payer: Self-pay | Admitting: Pulmonary Disease

## 2018-05-07 ENCOUNTER — Ambulatory Visit (INDEPENDENT_AMBULATORY_CARE_PROVIDER_SITE_OTHER): Admitting: Pulmonary Disease

## 2018-05-07 VITALS — BP 130/84 | HR 99 | Ht 66.0 in | Wt 150.0 lb

## 2018-05-07 DIAGNOSIS — G4726 Circadian rhythm sleep disorder, shift work type: Secondary | ICD-10-CM

## 2018-05-07 DIAGNOSIS — G4733 Obstructive sleep apnea (adult) (pediatric): Secondary | ICD-10-CM | POA: Diagnosis not present

## 2018-05-07 NOTE — Patient Instructions (Signed)
Will arrange for in lab sleep study Will call to arrange for follow up after sleep study reviewed  

## 2018-05-07 NOTE — Progress Notes (Signed)
[facility]  Chief Complaint  Patient presents with  . Follow-up    Pt is not using cpap machine and not using oral appliance. Pt is not wanting to use cpap machine.    Constitutional:  BP 130/84 (BP Location: Left Arm, Cuff Size: Normal)   Pulse 99   Ht 5\' 6"  (1.676 m)   Wt 150 lb (68 kg)   SpO2 98%   BMI 24.21 kg/m   Past Medical History:  GERD, HLD, Vitamin D deficiency  History of Present Illness: Antonio [name] is a 32 y.o. male with obstructive sleep apnea.  He hasn't used oral appliance for a while and no longer uses CPAP.  He was started on adderall.  He still works second shift.  He works from 2 pm to 1 am.  He goes to sleep at 4 am and sleeps until 11 am.  He then naps for another 1 hour before going to work.  He takes adderall before going to work.  He doesn't think he is snoring as much, but he still feels tired during the day.  This has been going on for years.  Respiratory Exam: Appearance - well kempt  ENMT - clear nasal mucosa, midline nasal septum, no oral exudates, no LAN, trachea midline Respiratory - normal chest wall, normal respiratory effort, no accessory muscle use, no wheeze/rales CV - s1s2 regular rate and rhythm, no murmurs, no peripheral edema, radial pulses symmetric GI - soft, non tender, no masses Lymph - no adenopathy noted in neck and axillary areas MSK - normal muscle strength and tone, normal gait, wearing a boot on Lt leg Ext - no cyanosis, clubbing, or joint inflammation noted Skin - no rashes, lesions, or ulcers Neuro - oriented to person, place, and time Psych - normal mood and affect   Assessment/Plan:  Obstructive sleep apnea. - he isn't sure that he still has sleep apnea - will repeat in lab sleep study  Shift work schedule. - discussed regular/sleep wake schedule and proper sleep hygiene  ADHD. - he has been prescribed adderall   Patient Instructions  Will arrange for in lab sleep  study Will call to arrange for follow up after sleep study reviewed     Chesley Mires, MD [place] 05/07/2018, 9:31 AM  Flow Sheet  Sleep tests: HST 12/22/15 >> AHI 11.6, SpO2 82% PSG with oral appliance 07/22/17 >> AHI 3, SpO2 low 92%  Medications: Allergies as of 05/07/2018   No Known Allergies     Medication List        Accurate as of 05/07/18  9:31 AM. Always use your most recent med list.          amphetamine-dextroamphetamine 10 MG tablet Commonly known as:  ADDERALL Take 10 mg by mouth daily with breakfast.   buPROPion 150 MG 24 hr tablet Commonly known as:  WELLBUTRIN XL Take 300 mg by mouth every morning.   citalopram 20 MG tablet Commonly known as:  CELEXA Take 1 tablet (20 mg total) by mouth daily.   pantoprazole 40 MG tablet Commonly known as:  PROTONIX Take 1 tablet (40 mg total) by mouth daily.       Past Surgical History: He  has a past surgical history that includes Mandible fracture surgery; Hernia repair (2016); and Wisdom tooth extraction.  Family History: His family history is positive for sleep apnea.  Social History: He  reports that he has never smoked. He has never used  smokeless tobacco. He reports that he drinks alcohol. He reports that he does not use drugs.

## 2018-06-16 ENCOUNTER — Ambulatory Visit (HOSPITAL_BASED_OUTPATIENT_CLINIC_OR_DEPARTMENT_OTHER): Attending: Pulmonary Disease | Admitting: Pulmonary Disease

## 2018-06-16 VITALS — Ht 67.0 in | Wt 145.0 lb

## 2018-06-16 DIAGNOSIS — R0683 Snoring: Secondary | ICD-10-CM | POA: Diagnosis present

## 2018-06-16 DIAGNOSIS — G4733 Obstructive sleep apnea (adult) (pediatric): Secondary | ICD-10-CM

## 2018-07-16 ENCOUNTER — Telehealth: Payer: Self-pay | Admitting: Pulmonary Disease

## 2018-07-16 DIAGNOSIS — G4733 Obstructive sleep apnea (adult) (pediatric): Secondary | ICD-10-CM | POA: Diagnosis not present

## 2018-07-16 NOTE — Procedures (Signed)
    Patient Name: Antonio [name], Antonio [name] Date: 06/16/2018 Gender: Male D.O.B: 07-05-1986 Age (years): 31 Referring Provider: Chesley Mires MD, ABSM Height (inches): 34 Interpreting Physician: Chesley Mires MD, ABSM Weight (lbs): 145 RPSGT: Earney [place] BMI: 23 MRN: [id number] Neck Size: 16.00 <br> <br>  CLINICAL INFORMATION  Sleep Study Type: NPSG  Indication for sleep study: 32 year old with prior history of sleep apnea with improved sleep pattern off therapy.  He is referred to sleep apnea to assess whether he still has sleep apnea.  Epworth Sleepiness Score: 5   Most recent polysomnogram dated 07/22/2017 revealed an AHI of 3.0/h and RDI of 4.2/h. SLEEP STUDY TECHNIQUE  As per the AASM Manual for the Scoring of Sleep and Associated Events v2.3 (April 2016) with a hypopnea requiring 4% desaturations. The channels recorded and monitored were frontal, central and occipital EEG, electrooculogram (EOG), submentalis EMG (chin), nasal and oral airflow, thoracic and abdominal wall motion, anterior tibialis EMG, snore microphone, electrocardiogram, and pulse oximetry. MEDICATIONS  Medications self-administered by patient taken the night of the study : N/A SLEEP ARCHITECTURE  The study was initiated at 9:51:15 PM and ended at 4:33:41 AM. Sleep onset time was 54.8 minutes and the sleep efficiency was 73.6%%. The total sleep time was 296 minutes. Stage REM latency was 164.0 minutes. The patient spent 3.5%% of the night in stage N1 sleep, 89.7%% in stage N2 sleep, 0.0%% in stage N3 and 6.8% in REM. Alpha intrusion was absent. Supine sleep was 51.35%. RESPIRATORY PARAMETERS  The overall apnea/hypopnea index (AHI) was 2.2 per hour. There were 2 total apneas, including 2 obstructive, 0 central and 0 mixed apneas. There were 9 hypopneas and 11 RERAs. The AHI during Stage REM sleep was 0.0 per hour. AHI while supine was 2.8 per hour. The mean oxygen saturation was 95.1%. The minimum SpO2  during sleep was 85.0%. soft snoring was noted during this study. CARDIAC DATA  The 2 lead EKG demonstrated sinus rhythm. The mean heart rate was 76.5 beats per minute. Other EKG findings include: None. LEG MOVEMENT DATA  The total PLMS were 0 with a resulting PLMS index of 0.0. Associated arousal with leg movement index was 0.0 . IMPRESSIONS  - This study did not show evidence for significant obstructive sleep apnea.  His overall AHI was 2.2 with an SpO2 low of 85%. - The patient snored with soft snoring volume. DIAGNOSIS  - Snoring. RECOMMENDATIONS  - Based on this studies findings, he can safely forego therapy for sleep apnea.  [Electronically signed] 07/16/2018 07:47 AM  Chesley Mires MD, ABSM Diplomate, American Board of Sleep Medicine  NPI: [id number]

## 2018-07-16 NOTE — Telephone Encounter (Signed)
PSG 06/16/18 >> AHI 2.2, SpO2 low 85%   Please let him know that his sleep study didn't show sleep apnea.  He does not need any additional testing or therapy at this time.  He can call to schedule a follow up appointment as needed if his sleep pattern gets worse.

## 2018-07-17 ENCOUNTER — Ambulatory Visit: Admitting: Pulmonary Disease

## 2018-07-18 NOTE — Telephone Encounter (Signed)
Called and spoke with patient regarding results.  Informed the patient of results and recommendations today. Pt verbalized understanding and denied any questions or concerns at this time.  Nothing further needed.  

## 2019-02-04 DIAGNOSIS — B360 Pityriasis versicolor: Secondary | ICD-10-CM | POA: Insufficient documentation

## 2019-02-04 DIAGNOSIS — R45851 Suicidal ideations: Secondary | ICD-10-CM

## 2019-02-04 HISTORY — DX: Pityriasis versicolor: B36.0

## 2019-02-04 HISTORY — DX: Suicidal ideations: R45.851

## 2019-02-22 ENCOUNTER — Other Ambulatory Visit: Payer: Self-pay

## 2019-02-22 ENCOUNTER — Encounter (HOSPITAL_COMMUNITY): Payer: Self-pay | Admitting: Emergency Medicine

## 2019-02-22 ENCOUNTER — Emergency Department (HOSPITAL_COMMUNITY)
Admission: EM | Admit: 2019-02-22 | Discharge: 2019-02-22 | Disposition: A | Discharge status: Home / Self Care | Attending: Emergency Medicine | Admitting: Emergency Medicine

## 2019-02-22 DIAGNOSIS — Z79899 Other long term (current) drug therapy: Secondary | ICD-10-CM | POA: Diagnosis not present

## 2019-02-22 DIAGNOSIS — R51 Headache: Secondary | ICD-10-CM | POA: Diagnosis present

## 2019-02-22 DIAGNOSIS — K2960 Other gastritis without bleeding: Secondary | ICD-10-CM | POA: Diagnosis not present

## 2019-02-22 DIAGNOSIS — K2900 Acute gastritis without bleeding: Secondary | ICD-10-CM

## 2019-02-22 DIAGNOSIS — G44209 Tension-type headache, unspecified, not intractable: Secondary | ICD-10-CM | POA: Diagnosis not present

## 2019-02-22 LAB — CBC
HCT: 43.2 % (ref 39.0–52.0)
Hemoglobin: 14.5 g/dL (ref 13.0–17.0)
MCH: 29.3 pg (ref 26.0–34.0)
MCHC: 33.6 g/dL (ref 30.0–36.0)
MCV: 87.3 fL (ref 80.0–100.0)
Platelets: 343 10*3/uL (ref 150–400)
RBC: 4.95 MIL/uL (ref 4.22–5.81)
RDW: 11.8 % (ref 11.5–15.5)
WBC: 7.4 10*3/uL (ref 4.0–10.5)
nRBC: 0 % (ref 0.0–0.2)

## 2019-02-22 LAB — URINALYSIS, ROUTINE W REFLEX MICROSCOPIC
Bilirubin Urine: NEGATIVE
Glucose, UA: NEGATIVE mg/dL
Hgb urine dipstick: NEGATIVE
Ketones, ur: NEGATIVE mg/dL
Leukocytes,Ua: NEGATIVE
Nitrite: NEGATIVE
Protein, ur: NEGATIVE mg/dL
Specific Gravity, Urine: 1.032 — ABNORMAL HIGH (ref 1.005–1.030)
pH: 5 (ref 5.0–8.0)

## 2019-02-22 LAB — COMPREHENSIVE METABOLIC PANEL
ALT: 23 U/L (ref 0–44)
AST: 30 U/L (ref 15–41)
Albumin: 4.5 g/dL (ref 3.5–5.0)
Alkaline Phosphatase: 80 U/L (ref 38–126)
Anion gap: 9 (ref 5–15)
BUN: 23 mg/dL — ABNORMAL HIGH (ref 6–20)
CO2: 25 mmol/L (ref 22–32)
Calcium: 8.9 mg/dL (ref 8.9–10.3)
Chloride: 103 mmol/L (ref 98–111)
Creatinine, Ser: 1.14 mg/dL (ref 0.61–1.24)
GFR calc Af Amer: >60 mL/min (ref >60)
GFR calc non Af Amer: >60 mL/min (ref >60)
Glucose, Bld: 82 mg/dL (ref 70–99)
Potassium: 3.9 mmol/L (ref 3.5–5.1)
Sodium: 137 mmol/L (ref 135–145)
Total Bilirubin: 0.6 mg/dL (ref 0.3–1.2)
Total Protein: 7.2 g/dL (ref 6.5–8.1)

## 2019-02-22 LAB — LIPASE, BLOOD: Lipase: 28 U/L (ref 11–51)

## 2019-02-22 MED ORDER — SODIUM CHLORIDE 0.9% FLUSH: 3.0000 mL | Freq: Once | INTRAVENOUS | Status: DC

## 2019-02-22 MED ORDER — SODIUM CHLORIDE 0.9 % IV BOLUS
1000.0000 mL | Freq: Once | INTRAVENOUS | Status: AC
  Administered 2019-02-22: 1000 mL via INTRAVENOUS

## 2019-02-22 MED ORDER — ACETAMINOPHEN 500 MG PO TABS
1000.0000 mg | ORAL_TABLET | Freq: Once | ORAL | Status: AC
  Administered 2019-02-22: 1000 mg via ORAL
  Filled 2019-02-22: qty 2

## 2019-02-22 MED ORDER — LIDOCAINE VISCOUS HCL 2 % MT SOLN
15.0000 mL | Freq: Once | OROMUCOSAL | Status: AC
  Administered 2019-02-22: 15 mL via ORAL
  Filled 2019-02-22: qty 15

## 2019-02-22 MED ORDER — ALUM & MAG HYDROXIDE-SIMETH 200-200-20 MG/5ML PO SUSP
30.0000 mL | Freq: Once | ORAL | Status: AC
  Administered 2019-02-22: 30 mL via ORAL
  Filled 2019-02-22: qty 30

## 2019-02-22 NOTE — ED Notes (Signed)
Pt ambulated to BR and is attempting to give urine specimen. Pt has steady gait.

## 2019-02-22 NOTE — ED Provider Notes (Signed)
[facility] DEPT Provider Note  CSN: [id number] Arrival date & time: 02/22/19 0147  Chief Complaint(s) Headache and Abdominal Pain  HPI Antonio [name] is a 33 y.o. male with a past medical history listed below who presents to the emergency department with 2 days of bitemporal headache mildly relieved by Antonio [name] powder.  No other alleviating or aggravating factors.  Patient has had similar headaches in the past but they have not lasted this long.  Denies any fevers or chills.  No nuchal rigidity.  No visual disturbance or focal deficits.  Patient did report working outside in the heat.  States that he is trying to hydrate as much as he can.  Additionally patient endorses epigastric abdominal pain after taking Goody powder.  Some nausea without emesis.  No diarrhea.  No suspicious food intake.  Denies any other physical complaints.  HPI  Past Medical History Past Medical History:  Diagnosis Date  . GERD (gastroesophageal reflux disease)   . HLD (hyperlipidemia)   . Vitamin D deficiency    Patient Active Problem List   Diagnosis Date Noted  . OSA on CPAP 01/06/2016  . GAD (generalized anxiety disorder) 02/16/2014  . Family history of hemochromatosis 09/08/2013  . GERD (gastroesophageal reflux disease)   . Vitamin D deficiency   . HLD (hyperlipidemia)    Home Medication(s) Prior to Admission medications   Medication Sig Start Date End Date Taking? Authorizing Provider  amphetamine-dextroamphetamine (ADDERALL XR) 20 MG 24 hr capsule Take 40 mg by mouth daily.   Yes [provider]  Aspirin-Salicylamide-Caffeine (BC FAST PAIN RELIEF) 650-195-33.3 MG PACK Take 1-2 Packages by mouth every 6 (six) hours as needed (pain).   Yes [provider]  buPROPion (WELLBUTRIN XL) 150 MG 24 hr tablet Take 450 mg by mouth every morning.  12/05/16  Yes [provider]  escitalopram (LEXAPRO) 20 MG tablet Take 20 mg by mouth daily.   Yes [provider]  pantoprazole (PROTONIX) 40 MG tablet Take 1 tablet (40 mg total) by mouth daily. 02/16/14  Yes Martin, Mary-Margaret, FNP  traZODone (DESYREL) 50 MG tablet Take 50 mg by mouth at bedtime.  12/30/18  Yes [provider]  citalopram (CELEXA) 20 MG tablet Take 1 tablet (20 mg total) by mouth daily. Patient not taking: Reported on 02/22/2019 02/16/14   Chevis Pretty, FNP                                                                                                                                    Past Surgical History Past Surgical History:  Procedure Laterality Date  . HERNIA REPAIR  2016  . MANDIBLE FRACTURE SURGERY    . WISDOM TOOTH EXTRACTION     Family History History reviewed. No pertinent family history.  Social History Social History   Tobacco Use  . Smoking status: Never Smoker  . Smokeless tobacco: Never Used  Substance Use  Topics  . Alcohol use: Yes    Alcohol/week: 0.0 standard drinks    Comment: 3-4 beers daily  . Drug use: No   Allergies Patient has no known allergies.  Review of Systems Review of Systems All other systems are reviewed and are negative for acute change except as noted in the HPI  Physical Exam Vital Signs  I have reviewed the triage vital signs BP 118/71 (BP Location: Left Arm)   Pulse 91   Temp 98.2 F (36.8 C) (Oral)   Resp 18   Ht 5\' 7"  (1.702 m)   Wt 63.5 kg   SpO2 97%   BMI 21.93 kg/m   Physical Exam Vitals signs reviewed.  Constitutional:      General: He is not in acute distress.    Appearance: He is well-developed. He is not diaphoretic.  HENT:     Head: Normocephalic and atraumatic.     Jaw: No trismus.     Right Ear: External ear normal.     Left Ear: External ear normal.     Nose: Nose normal.  Eyes:     General: No scleral icterus.    Conjunctiva/sclera: Conjunctivae normal.  Neck:     Musculoskeletal: Normal range of motion.     Trachea: Phonation normal.  Cardiovascular:     Rate  and Rhythm: Normal rate and regular rhythm.  Pulmonary:     Effort: Pulmonary effort is normal. No respiratory distress.     Breath sounds: No stridor.  Abdominal:     General: There is no distension.     Tenderness: There is no abdominal tenderness.  Musculoskeletal: Normal range of motion.  Neurological:     Mental Status: He is alert and oriented to person, place, and time.     Comments: Mental Status:  Alert and oriented to person, place, and time.  Attention and concentration normal.  Speech clear.  Recent memory is intact  Cranial Nerves:  II Visual Fields: Intact to confrontation. Visual fields intact. III, IV, VI: Pupils equal and reactive to light and near. Full eye movement without nystagmus  V Facial Sensation: Normal. No weakness of masticatory muscles  VII: No facial weakness or asymmetry  VIII Auditory Acuity: Grossly normal  IX/X: The uvula is midline; the palate elevates symmetrically  XI: Normal sternocleidomastoid and trapezius strength  XII: The tongue is midline. No atrophy or fasciculations.   Motor System: Muscle Strength: 5/5 and symmetric in the upper and lower extremities. No pronation or drift.  Muscle Tone: Tone and muscle bulk are normal in the upper and lower extremities.   Reflexes: DTRs: 1+ and symmetrical in all four extremities. No Clonus Coordination:No tremor.  Sensation: Intact to light touch.  Gait: Routine  gait normal.   Psychiatric:        Behavior: Behavior normal.     ED Results and Treatments Labs (all labs ordered are listed, but only abnormal results are displayed) Labs Reviewed  COMPREHENSIVE METABOLIC PANEL - Abnormal; Notable for the following components:      Result Value   BUN 23 (*)    All other components within normal limits  URINALYSIS, ROUTINE W REFLEX MICROSCOPIC - Abnormal; Notable for the following components:   Specific Gravity, Urine 1.032 (*)    All other components within normal limits  LIPASE, BLOOD  CBC  EKG  EKG Interpretation  Date/Time:    Ventricular Rate:    PR Interval:    QRS Duration:   QT Interval:    QTC Calculation:   R Axis:     Text Interpretation:        Radiology No results found.  Pertinent labs & imaging results that were available during my care of the patient were reviewed by me and considered in my medical decision making (see chart for details).  Medications Ordered in ED Medications  sodium chloride flush (NS) 0.9 % injection 3 mL (has no administration in time range)  sodium chloride 0.9 % bolus 1,000 mL (0 mLs Intravenous Stopped 02/22/19 0518)  acetaminophen (TYLENOL) tablet 1,000 mg (1,000 mg Oral Given 02/22/19 0402)  alum & mag hydroxide-simeth (MAALOX/MYLANTA) 200-200-20 MG/5ML suspension 30 mL (30 mLs Oral Given 02/22/19 0402)    And  lidocaine (XYLOCAINE) 2 % viscous mouth solution 15 mL (15 mLs Oral Given 02/22/19 0402)                                                                                                                                    Procedures Procedures  (including critical care time)  Medical Decision Making / ED Course I have reviewed the nursing notes for this encounter and the patient's prior records (if available in EHR or on provided paperwork).   Antonio [name] was evaluated in Emergency Department on 02/22/2019 for the symptoms described in the history of present illness. He was evaluated in the context of the global COVID-19 pandemic, which necessitated consideration that the patient might be at risk for infection with the SARS-CoV-2 virus that causes COVID-19. Institutional protocols and algorithms that pertain to the evaluation of patients at risk for COVID-19 are in a state of rapid change based on information released by regulatory bodies including the CDC and federal and state organizations. These  policies and algorithms were followed during the patient's care in the ED.  1. Headache Typical  headache for the pt. Non focal neuro exam. No recent head trauma. No fever. Doubt meningitis. Doubt intracranial bleed. Doubt IIH. No indication for imaging. Provided with tylenol and IVF.  Pain improved.  2. abd discomfort. This was following BC/Goody powder use.  History of GERD.  Abdomen benign.  Screening labs obtained and triage were grossly reassuring without leukocytosis or anemia.  No significant electrolyte derangement or renal sufficiency.  No evidence of bili obstruction or pancreatitis.  Doubt serious intra-abdominal Fama to assess infectious process or bowel obstruction.  Provided with GI cocktail resulting in complete resolution of his pain.  The patient is safe for discharge with strict return precautions.         Final Clinical Impression(s) / ED Diagnoses Final diagnoses:  Tension headache  Other acute gastritis, presence of bleeding unspecified     The patient appears reasonably screened and/or stabilized for discharge and I doubt any other  medical condition or other [facility] requiring further screening, evaluation, or treatment in the ED at this time prior to discharge.  Disposition: Discharge  Condition: Good  I have discussed the results, Dx and Tx plan with the patient who expressed understanding and agree(s) with the plan. Discharge instructions discussed at great length. The patient was given strict return precautions who verbalized understanding of the instructions. No further questions at time of discharge.    ED Discharge Orders    None       Follow Up: Stephens Shire, MD [address] [place] [address] [phone]   For help establishing care with a care provider     This chart was dictated using voice recognition software.  Despite best efforts to proofread,  errors can occur which can change the documentation meaning.    Fatima Blank, MD 02/22/19 [phone]

## 2019-02-22 NOTE — ED Triage Notes (Signed)
Patient complaining is complaining of a headache that started two days ago. Patient states that he took medication for a headache and it has not worked. Patient states his abdomen feels funny and he feels nauseas.

## 2019-02-22 NOTE — ED Notes (Signed)
Patient was verbalized discharge instructions. Pt had no further questions at this time. NAD. 

## 2019-05-09 DIAGNOSIS — S86119A Strain of other muscle(s) and tendon(s) of posterior muscle group at lower leg level, unspecified leg, initial encounter: Secondary | ICD-10-CM | POA: Insufficient documentation

## 2019-05-09 HISTORY — DX: Strain of other muscle(s) and tendon(s) of posterior muscle group at lower leg level, unspecified leg, initial encounter: S86.119A

## 2019-06-19 ENCOUNTER — Encounter: Payer: Self-pay | Admitting: Adult Health

## 2019-06-19 ENCOUNTER — Other Ambulatory Visit: Payer: Self-pay

## 2019-06-19 ENCOUNTER — Ambulatory Visit (INDEPENDENT_AMBULATORY_CARE_PROVIDER_SITE_OTHER): Admitting: Adult Health

## 2019-06-19 ENCOUNTER — Encounter (INDEPENDENT_AMBULATORY_CARE_PROVIDER_SITE_OTHER): Payer: Self-pay

## 2019-06-19 DIAGNOSIS — F909 Attention-deficit hyperactivity disorder, unspecified type: Secondary | ICD-10-CM

## 2019-06-19 DIAGNOSIS — F411 Generalized anxiety disorder: Secondary | ICD-10-CM

## 2019-06-19 DIAGNOSIS — G4700 Insomnia, unspecified: Secondary | ICD-10-CM | POA: Diagnosis not present

## 2019-06-19 DIAGNOSIS — F4310 Post-traumatic stress disorder, unspecified: Secondary | ICD-10-CM

## 2019-06-19 DIAGNOSIS — F331 Major depressive disorder, recurrent, moderate: Secondary | ICD-10-CM | POA: Diagnosis not present

## 2019-06-19 MED ORDER — BUPROPION HCL ER (XL) 150 MG PO TB24: 450.0000 mg | ORAL_TABLET | ORAL | 5 refills | Status: DC

## 2019-06-19 MED ORDER — ESCITALOPRAM OXALATE 20 MG PO TABS: 20.0000 mg | ORAL_TABLET | Freq: Every day | ORAL | 5 refills | Status: DC

## 2019-06-19 MED ORDER — TRAZODONE HCL 50 MG PO TABS: 50.0000 mg | ORAL_TABLET | Freq: Every day | ORAL | 5 refills | Status: DC

## 2019-06-19 NOTE — Progress Notes (Signed)
Antonio [name]:[id number] [birth year] 33 y.o.  Subjective:   Patient ID:  Antonio [name] is a 33 y.o. (DOB Jun 29, 1986) male.  Chief Complaint: No chief complaint on file.   HPI JAICEION HAGEN presents to the office today for follow-up of anxiety, depression, insomnia, and PTSD.  Describes mood today as "ok". Pleasant. Mood symptoms - denies depression, anxiety, and irritability. Stating "I am doing pretty good". Continues to struggle in social settings. Stating "I don't get how people act the way they do". He and wife doing well. Upcoming surgery for tendon repair in ankle. Stable interest and motivation. Taking medications as prescribed.  Energy levels stable. Active, does not have a regular exercise routine. Works full-time at [employer]. Enjoys some usual interests and activities. Spending time with family - wife and dog. Moved into a new house. Sister local - "hooked on drugs". Aunts and grandmother local. Appetite adequate. Weight stable. Sleeps well most nights. Averages 6 hours. Has trouble sleeping - "Trazadone has helped". Focus and concentration stable. Completing tasks. Managing aspects of household. Work going well.  Denies SI or HI. Denies AH or VH.  Review of Systems:  Review of Systems  Musculoskeletal: Positive for gait problem.  Neurological: Negative for tremors and headaches.  Psychiatric/Behavioral: Positive for sleep disturbance.  All other systems reviewed and are negative.   Medications: I have reviewed the patient's current medications.  Current Outpatient Medications  Medication Sig Dispense Refill  . amphetamine-dextroamphetamine (ADDERALL XR) 20 MG 24 hr capsule Take 40 mg by mouth daily.    . Aspirin-Salicylamide-Caffeine (BC FAST PAIN RELIEF) 650-195-33.3 MG PACK Take 1-2 Packages by mouth every 6 (six) hours as needed (pain).    Marland Kitchen buPROPion (WELLBUTRIN XL) 150 MG 24 hr tablet Take 450 mg by mouth every morning.     . escitalopram (LEXAPRO) 20 MG  tablet Take 20 mg by mouth daily.    . pantoprazole (PROTONIX) 40 MG tablet Take 1 tablet (40 mg total) by mouth daily. 30 tablet 3  . traZODone (DESYREL) 50 MG tablet Take 50 mg by mouth at bedtime.      No current facility-administered medications for this visit.     Medication Side Effects: None  Allergies: No Known Allergies  Past Medical History:  Diagnosis Date  . GERD (gastroesophageal reflux disease)   . HLD (hyperlipidemia)   . Vitamin D deficiency     No family history on file.  Social History   Socioeconomic History  . Marital status: Married    Spouse name: Not on file  . Number of children: Not on file  . Years of education: Not on file  . Highest education level: Not on file  Occupational History  . Occupation: Build Electrical engineer Needs  . Financial resource strain: Not on file  . Food insecurity    Worry: Not on file    Inability: Not on file  . Transportation needs    Medical: Not on file    Non-medical: Not on file  Tobacco Use  . Smoking status: Never Smoker  . Smokeless tobacco: Never Used  Substance and Sexual Activity  . Alcohol use: Yes    Alcohol/week: 0.0 standard drinks    Comment: 3-4 beers daily  . Drug use: No  . Sexual activity: Not on file  Lifestyle  . Physical activity    Days per week: Not on file    Minutes per session: Not on file  . Stress: Not on file  Relationships  .  Social Herbalist on phone: Not on file    Gets together: Not on file    Attends religious service: Not on file    Active member of club or organization: Not on file    Attends meetings of clubs or organizations: Not on file    Relationship status: Not on file  . Intimate partner violence    Fear of current or ex partner: Not on file    Emotionally abused: Not on file    Physically abused: Not on file    Forced sexual activity: Not on file  Other Topics Concern  . Not on file  Social History Narrative  . Not on file    Past Medical  History, Surgical history, Social history, and Family history were reviewed and updated as appropriate.   Please see review of systems for further details on the patient's review from today.   Objective:   Physical Exam:  There were no vitals taken for this visit.   Physical Exam  Neurological: He is alert. Gait normal.  Psychiatric: Mood, memory, affect and judgment normal.    Lab Review:     Component Value Date/Time   NA 137 02/22/2019 0239   NA 142 02/16/2014 1501   K 3.9 02/22/2019 0239   CL 103 02/22/2019 0239   CO2 25 02/22/2019 0239   GLUCOSE 82 02/22/2019 0239   BUN 23 (H) 02/22/2019 0239   BUN 13 02/16/2014 1501   CREATININE 1.14 02/22/2019 0239   CALCIUM 8.9 02/22/2019 0239   PROT 7.2 02/22/2019 0239   PROT 6.6 02/16/2014 1501   ALBUMIN 4.5 02/22/2019 0239   ALBUMIN 4.9 02/16/2014 1501   AST 30 02/22/2019 0239   ALT 23 02/22/2019 0239   ALKPHOS 80 02/22/2019 0239   BILITOT 0.6 02/22/2019 0239   GFRNONAA >60 02/22/2019 0239   GFRAA >60 02/22/2019 0239       Component Value Date/Time   WBC 7.4 02/22/2019 0239   RBC 4.95 02/22/2019 0239   HGB 14.5 02/22/2019 0239   HCT 43.2 02/22/2019 0239   PLT 343 02/22/2019 0239   MCV 87.3 02/22/2019 0239   MCV 86.9 08/01/2013 1040   MCH 29.3 02/22/2019 0239   MCHC 33.6 02/22/2019 0239   RDW 11.8 02/22/2019 0239    No results found for: POCLITH, LITHIUM   No results found for: PHENYTOIN, PHENOBARB, VALPROATE, CBMZ   .res Assessment: Plan:    Plan:  1. Trazadone 50mg  - 1 to 2 at hs 2. Lexapro 20mg  daily 3. Wellbutrin XL 450mg    RTC 3 months  Patient advised to contact office with any questions, adverse effects, or acute worsening in signs and symptoms.  There are no diagnoses linked to this encounter.   Please see After Visit Summary for patient specific instructions.  No future appointments.  No orders of the defined types were placed in this encounter.   -------------------------------

## 2019-07-17 ENCOUNTER — Ambulatory Visit (INDEPENDENT_AMBULATORY_CARE_PROVIDER_SITE_OTHER): Admitting: Adult Health

## 2019-07-17 ENCOUNTER — Other Ambulatory Visit: Payer: Self-pay

## 2019-07-17 ENCOUNTER — Encounter: Payer: Self-pay | Admitting: Adult Health

## 2019-07-17 DIAGNOSIS — F909 Attention-deficit hyperactivity disorder, unspecified type: Secondary | ICD-10-CM | POA: Diagnosis not present

## 2019-07-17 DIAGNOSIS — F331 Major depressive disorder, recurrent, moderate: Secondary | ICD-10-CM | POA: Diagnosis not present

## 2019-07-17 DIAGNOSIS — F3181 Bipolar II disorder: Secondary | ICD-10-CM

## 2019-07-17 DIAGNOSIS — G4700 Insomnia, unspecified: Secondary | ICD-10-CM | POA: Diagnosis not present

## 2019-07-17 DIAGNOSIS — F4310 Post-traumatic stress disorder, unspecified: Secondary | ICD-10-CM

## 2019-07-17 DIAGNOSIS — F411 Generalized anxiety disorder: Secondary | ICD-10-CM

## 2019-07-17 MED ORDER — LATUDA 20 MG PO TABS: 20.0000 mg | ORAL_TABLET | Freq: Every day | ORAL | 2 refills | Status: DC

## 2019-07-17 NOTE — Progress Notes (Signed)
Antonio [name] HO:[id number] [birth year] 33 y.o.  Subjective:   Patient ID:  Antonio [name] is a 33 y.o. (DOB [birth year]) male.  Chief Complaint: No chief complaint on file.   HPI Antonio [name] presents to the office today for follow-up of anxiety, depression, insomnia, ADHD and PTSD.  Describes mood today as "ok". Pleasant. Mood symptoms - reports depression, anxiety, and irritability. More anxious overall. Stating "I feel like "I'm in a funk". Can't sit down and play a guitar - "hands shaking". Wanting to start a [employer] claim - "sleep". Stating "I'm glad I served". Recent surgery for a torn ligament - 3 month recovery time. Stating "I'm just sitting at home". Wanting to do more but "can't". He and wife doing well.  Stable interest and motivation. Taking medications as prescribed.  Energy levels decent "considering I'm on crutches". Active, does not have a regular exercise routine. Works full-time at [employer] - out on disability.  Enjoys some usual interests and activities. Spending time with family - wife and dog. Playing guitar, watching TV. Family local.  Appetite decreased. Weight stable. Sleeps well most nights. Averages 6 to 8 hours with Trazadone.  Focus and concentration "it's a mess right now". Completing tasks. Managing aspects of household. Work going well.  Denies SI or HI. Denies AH or VH.   PHQ2-9     Office Visit from 12/24/2013 in [place] Visit from 10/09/2013 in [facility]  PHQ-2 Total Score  0  0      Review of Systems:  Review of Systems  Musculoskeletal: Negative for gait problem.  Neurological: Negative for tremors.  Psychiatric/Behavioral:       Please refer to HPI    Medications: I have reviewed the patient's current medications.  Current Outpatient Medications  Medication Sig Dispense Refill  . amphetamine-dextroamphetamine (ADDERALL XR) 20 MG 24 hr capsule Take 40 mg by mouth daily.    .  Aspirin-Salicylamide-Caffeine (BC FAST PAIN RELIEF) 650-195-33.3 MG PACK Take 1-2 Packages by mouth every 6 (six) hours as needed (pain).    Marland Kitchen buPROPion (WELLBUTRIN XL) 150 MG 24 hr tablet Take 3 tablets (450 mg total) by mouth every morning. 90 tablet 5  . escitalopram (LEXAPRO) 20 MG tablet Take 1 tablet (20 mg total) by mouth daily. 30 tablet 5  . lurasidone (LATUDA) 20 MG TABS tablet Take 1 tablet (20 mg total) by mouth daily. 30 tablet 2  . pantoprazole (PROTONIX) 40 MG tablet Take 1 tablet (40 mg total) by mouth daily. 30 tablet 3  . traZODone (DESYREL) 50 MG tablet Take 1 tablet (50 mg total) by mouth at bedtime. 60 tablet 5   No current facility-administered medications for this visit.    Medication Side Effects: None  Allergies: No Known Allergies  Past Medical History:  Diagnosis Date  . GERD (gastroesophageal reflux disease)   . HLD (hyperlipidemia)   . Vitamin D deficiency     No family history on file.  Social History   Socioeconomic History  . Marital status: Married    Spouse name: Not on file  . Number of children: Not on file  . Years of education: Not on file  . Highest education level: Not on file  Occupational History  . Occupation: Build guns  Tobacco Use  . Smoking status: Never Smoker  . Smokeless tobacco: Never Used  Substance and Sexual Activity  . Alcohol use: Yes    Alcohol/week: 0.0 standard drinks  Comment: 3-4 beers daily  . Drug use: No  . Sexual activity: Not on file  Other Topics Concern  . Not on file  Social History Narrative  . Not on file   Social Determinants of Health   Financial Resource Strain:   . Difficulty of Paying Living Expenses: Not on file  Food Insecurity:   . Worried About Charity fundraiser in the Last Year: Not on file  . Ran Out of Food in the Last Year: Not on file  Transportation Needs:   . Lack of Transportation (Medical): Not on file  . Lack of Transportation (Non-Medical): Not on file  Physical  Activity:   . Days of Exercise per Week: Not on file  . Minutes of Exercise per Session: Not on file  Stress:   . Feeling of Stress : Not on file  Social Connections:   . Frequency of Communication with Friends and Family: Not on file  . Frequency of Social Gatherings with Friends and Family: Not on file  . Attends Religious Services: Not on file  . Active Member of Clubs or Organizations: Not on file  . Attends Archivist Meetings: Not on file  . Marital Status: Not on file  Intimate Partner Violence:   . Fear of Current or Ex-Partner: Not on file  . Emotionally Abused: Not on file  . Physically Abused: Not on file  . Sexually Abused: Not on file    Past Medical History, Surgical history, Social history, and Family history were reviewed and updated as appropriate.   Please see review of systems for further details on the patient's review from today.   Objective:   Physical Exam:  There were no vitals taken for this visit.  Physical Exam Constitutional:      General: He is not in acute distress.    Appearance: He is well-developed.  Musculoskeletal:        General: No deformity.  Neurological:     Mental Status: He is alert and oriented to person, place, and time.     Coordination: Coordination normal.  Psychiatric:        Attention and Perception: Attention and perception normal. He does not perceive auditory or visual hallucinations.        Mood and Affect: Mood is anxious and depressed. Affect is not labile, blunt, angry or inappropriate.        Speech: Speech normal.        Behavior: Behavior is agitated.        Thought Content: Thought content normal. Thought content is not paranoid or delusional. Thought content does not include homicidal or suicidal ideation. Thought content does not include homicidal or suicidal plan.        Cognition and Memory: Cognition and memory normal.        Judgment: Judgment normal.     Comments: Insight intact     Lab  Review:     Component Value Date/Time   NA 137 02/22/2019 0239   NA 142 02/16/2014 1501   K 3.9 02/22/2019 0239   CL 103 02/22/2019 0239   CO2 25 02/22/2019 0239   GLUCOSE 82 02/22/2019 0239   BUN 23 (H) 02/22/2019 0239   BUN 13 02/16/2014 1501   CREATININE 1.14 02/22/2019 0239   CALCIUM 8.9 02/22/2019 0239   PROT 7.2 02/22/2019 0239   PROT 6.6 02/16/2014 1501   ALBUMIN 4.5 02/22/2019 0239   ALBUMIN 4.9 02/16/2014 1501   AST 30 02/22/2019 0239  ALT 23 02/22/2019 0239   ALKPHOS 80 02/22/2019 0239   BILITOT 0.6 02/22/2019 0239   GFRNONAA >60 02/22/2019 0239   GFRAA >60 02/22/2019 0239       Component Value Date/Time   WBC 7.4 02/22/2019 0239   RBC 4.95 02/22/2019 0239   HGB 14.5 02/22/2019 0239   HCT 43.2 02/22/2019 0239   PLT 343 02/22/2019 0239   MCV 87.3 02/22/2019 0239   MCV 86.9 08/01/2013 1040   MCH 29.3 02/22/2019 0239   MCHC 33.6 02/22/2019 0239   RDW 11.8 02/22/2019 0239    No results found for: POCLITH, LITHIUM   No results found for: PHENYTOIN, PHENOBARB, VALPROATE, CBMZ   .res Assessment: Plan:    Plan:  1. Trazadone 50mg  - 1 to 2 at hs 2. Lexapro 20mg  daily 3. Wellbutrin XL 450mg   4. Add Latuda 20mg  daily to target depression - 2 weeks of samples given.  RTC 3 months  Patient advised to contact office with any questions, adverse effects, or acute worsening in signs and symptoms.   Diagnoses and all orders for this visit:  Insomnia, unspecified type  Attention deficit hyperactivity disorder (ADHD), unspecified ADHD type  Major depressive disorder, recurrent episode, moderate (HCC)  Generalized anxiety disorder  PTSD (post-traumatic stress disorder)  Bipolar II disorder ([place])  Other orders -     lurasidone (LATUDA) 20 MG TABS tablet; Take 1 tablet (20 mg total) by mouth daily.     Please see After Visit Summary for patient specific instructions.  No future appointments.  No orders of the defined types were placed in this  encounter.   -------------------------------

## 2019-08-13 ENCOUNTER — Ambulatory Visit
Admission: RE | Admit: 2019-08-13 | Discharge: 2019-08-13 | Disposition: A | Discharge status: Home / Self Care | Source: Ambulatory Visit | Attending: Physician Assistant | Admitting: Physician Assistant

## 2019-08-13 ENCOUNTER — Other Ambulatory Visit: Payer: Self-pay | Admitting: Physician Assistant

## 2019-08-13 ENCOUNTER — Telehealth: Payer: Self-pay | Admitting: Pulmonary Disease

## 2019-08-13 DIAGNOSIS — M25462 Effusion, left knee: Secondary | ICD-10-CM

## 2019-08-13 NOTE — Telephone Encounter (Signed)
Spoke with the pt  He states that he is needing a letter stating that he was worked up for OSA and was found to not have OSA  He would like [place] to mail the letter to him  Please advise thanks!

## 2019-08-14 ENCOUNTER — Ambulatory Visit (INDEPENDENT_AMBULATORY_CARE_PROVIDER_SITE_OTHER): Admitting: Adult Health

## 2019-08-14 ENCOUNTER — Encounter: Payer: Self-pay | Admitting: Adult Health

## 2019-08-14 DIAGNOSIS — F411 Generalized anxiety disorder: Secondary | ICD-10-CM | POA: Diagnosis not present

## 2019-08-14 DIAGNOSIS — F4310 Post-traumatic stress disorder, unspecified: Secondary | ICD-10-CM

## 2019-08-14 DIAGNOSIS — F3181 Bipolar II disorder: Secondary | ICD-10-CM

## 2019-08-14 DIAGNOSIS — G4700 Insomnia, unspecified: Secondary | ICD-10-CM

## 2019-08-14 DIAGNOSIS — F331 Major depressive disorder, recurrent, moderate: Secondary | ICD-10-CM | POA: Diagnosis not present

## 2019-08-14 DIAGNOSIS — F909 Attention-deficit hyperactivity disorder, unspecified type: Secondary | ICD-10-CM

## 2019-08-14 NOTE — Progress Notes (Signed)
Antonio [name]:[id number] [birth year] 34 y.o.  Virtual Visit via Telephone Note  I connected with pt on 08/14/19 at  9:00 AM EST by telephone and verified that I am speaking with the correct person using two identifiers.   I discussed the limitations, risks, security and privacy concerns of performing an evaluation and management service by telephone and the availability of in person appointments. I also discussed with the patient that there may be a patient responsible charge related to this service. The patient expressed understanding and agreed to proceed.   I discussed the assessment and treatment plan with the patient. The patient was provided an opportunity to ask questions and all were answered. The patient agreed with the plan and demonstrated an understanding of the instructions.   The patient was advised to call back or seek an in-person evaluation if the symptoms worsen or if the condition fails to improve as anticipated.  I provided 30 minutes of non-face-to-face time during this encounter.  The patient was located at home.  The provider was located at [place].   Aloha Gell, NP   Subjective:   Patient ID:  Antonio [name] is a 34 y.o. (DOB 09-02-1985) male.  Chief Complaint:  Chief Complaint  Patient presents with  . Depression  . Anxiety  . ADHD  . Insomnia  . Trauma  . Other    Bipolar 2 disorder    HPI [name] presents for follow-up of anxiety, depression, insomnia, ADHD and PTSD.  Describes mood today as "so-so". Pleasant. Mood symptoms - decreased depression and irritability. More anxious overall. Stating "I'm trying to dig my way out of this". Plans to start "writing things down and keep things in order". Has filed a "claim" with the [place]. Continues to recover from surgery - "torn ligament". Stating "I'm just sitting at home". Playing guitar and watching TV. Trying to do some "odds and ends". He and wife doing well.  Stable interest  and motivation. Taking medications as prescribed.  Energy levels stable.  Active, does not have a regular exercise routine. Works full-time at [employer] - out on disability.  Enjoys some usual interests and activities. Married. Lives with wife and dog. Enjoyed the holidays. Family local.  Appetite stable. Weight gain - 5 pounds. Sleeps well most nights. Averages 6 to 8 hours with Trazadone.  Focus and concentration stable. Completing tasks. Managing aspects of household. Work going well.  Denies SI or HI. Denies AH or VH.  Review of Systems:  Review of Systems  Musculoskeletal: Negative for gait problem.  Neurological: Negative for tremors.  Psychiatric/Behavioral:       Please refer to HPI    Medications: I have reviewed the patient's current medications.  Current Outpatient Medications  Medication Sig Dispense Refill  . aspirin 81 MG EC tablet TAKE 1 TABLET BY MOUTH TWICE DAILY    . amphetamine-dextroamphetamine (ADDERALL XR) 20 MG 24 hr capsule Take 40 mg by mouth daily.    . Aspirin-Salicylamide-Caffeine (BC FAST PAIN RELIEF) 650-195-33.3 MG PACK Take 1-2 Packages by mouth every 6 (six) hours as needed (pain).    Marland Kitchen buPROPion (WELLBUTRIN XL) 150 MG 24 hr tablet Take 3 tablets (450 mg total) by mouth every morning. 90 tablet 5  . escitalopram (LEXAPRO) 20 MG tablet Take 1 tablet (20 mg total) by mouth daily. 30 tablet 5  . lurasidone (LATUDA) 20 MG TABS tablet Take 1 tablet (20 mg total) by mouth daily. 30 tablet 2  .  pantoprazole (PROTONIX) 40 MG tablet Take 1 tablet (40 mg total) by mouth daily. 30 tablet 3  . traZODone (DESYREL) 50 MG tablet Take 1 tablet (50 mg total) by mouth at bedtime. 60 tablet 5   No current facility-administered medications for this visit.    Medication Side Effects: None  Allergies: No Known Allergies  Past Medical History:  Diagnosis Date  . GERD (gastroesophageal reflux disease)   . HLD (hyperlipidemia)   . Vitamin D deficiency     No  family history on file.  Social History   Socioeconomic History  . Marital status: Married    Spouse name: Not on file  . Number of children: Not on file  . Years of education: Not on file  . Highest education level: Not on file  Occupational History  . Occupation: Build guns  Tobacco Use  . Smoking status: Never Smoker  . Smokeless tobacco: Never Used  Substance and Sexual Activity  . Alcohol use: Yes    Alcohol/week: 0.0 standard drinks    Comment: 3-4 beers daily  . Drug use: No  . Sexual activity: Not on file  Other Topics Concern  . Not on file  Social History Narrative  . Not on file   Social Determinants of Health   Financial Resource Strain:   . Difficulty of Paying Living Expenses: Not on file  Food Insecurity:   . Worried About Charity fundraiser in the Last Year: Not on file  . Ran Out of Food in the Last Year: Not on file  Transportation Needs:   . Lack of Transportation (Medical): Not on file  . Lack of Transportation (Non-Medical): Not on file  Physical Activity:   . Days of Exercise per Week: Not on file  . Minutes of Exercise per Session: Not on file  Stress:   . Feeling of Stress : Not on file  Social Connections:   . Frequency of Communication with Friends and Family: Not on file  . Frequency of Social Gatherings with Friends and Family: Not on file  . Attends Religious Services: Not on file  . Active Member of Clubs or Organizations: Not on file  . Attends Archivist Meetings: Not on file  . Marital Status: Not on file  Intimate Partner Violence:   . Fear of Current or Ex-Partner: Not on file  . Emotionally Abused: Not on file  . Physically Abused: Not on file  . Sexually Abused: Not on file    Past Medical History, Surgical history, Social history, and Family history were reviewed and updated as appropriate.   Please see review of systems for further details on the patient's review from today.   Objective:   Physical Exam:   There were no vitals taken for this visit.  Physical Exam Constitutional:      General: He is not in acute distress.    Appearance: He is well-developed.  Musculoskeletal:        General: No deformity.  Neurological:     Mental Status: He is alert and oriented to person, place, and time.     Coordination: Coordination normal.  Psychiatric:        Attention and Perception: Attention and perception normal. He does not perceive auditory or visual hallucinations.        Mood and Affect: Mood normal. Mood is not anxious or depressed. Affect is not labile, blunt, angry or inappropriate.        Speech: Speech normal.  Behavior: Behavior normal.        Thought Content: Thought content normal. Thought content is not paranoid or delusional. Thought content does not include homicidal or suicidal ideation. Thought content does not include homicidal or suicidal plan.        Cognition and Memory: Cognition and memory normal.        Judgment: Judgment normal.     Comments: Insight intact     Lab Review:     Component Value Date/Time   NA 137 02/22/2019 0239   NA 142 02/16/2014 1501   K 3.9 02/22/2019 0239   CL 103 02/22/2019 0239   CO2 25 02/22/2019 0239   GLUCOSE 82 02/22/2019 0239   BUN 23 (H) 02/22/2019 0239   BUN 13 02/16/2014 1501   CREATININE 1.14 02/22/2019 0239   CALCIUM 8.9 02/22/2019 0239   PROT 7.2 02/22/2019 0239   PROT 6.6 02/16/2014 1501   ALBUMIN 4.5 02/22/2019 0239   ALBUMIN 4.9 02/16/2014 1501   AST 30 02/22/2019 0239   ALT 23 02/22/2019 0239   ALKPHOS 80 02/22/2019 0239   BILITOT 0.6 02/22/2019 0239   GFRNONAA >60 02/22/2019 0239   GFRAA >60 02/22/2019 0239       Component Value Date/Time   WBC 7.4 02/22/2019 0239   RBC 4.95 02/22/2019 0239   HGB 14.5 02/22/2019 0239   HCT 43.2 02/22/2019 0239   PLT 343 02/22/2019 0239   MCV 87.3 02/22/2019 0239   MCV 86.9 08/01/2013 1040   MCH 29.3 02/22/2019 0239   MCHC 33.6 02/22/2019 0239   RDW 11.8 02/22/2019  0239    No results found for: POCLITH, LITHIUM   No results found for: PHENYTOIN, PHENOBARB, VALPROATE, CBMZ   .res Assessment: Plan:    Plan:  1. Trazadone 50mg  - 1 to 2 at hs 2. Lexapro 20mg  daily 3. Wellbutrin XL 450mg   4. Latuda 20mg  daily to target depression - 2 weeks of samples given - take with 350 calories daily - has not been taking consistently.  Taking ADHD medications from [facility]  RTC 4 months  Patient advised to contact office with any questions, adverse effects, or acute worsening in signs and symptoms.  Discussed potential metabolic side effects associated with atypical antipsychotics, as well as potential risk for movement side effects. Advised pt to contact office if movement side effects occur.    Deny was seen today for depression, anxiety, adhd, insomnia, trauma and other.  Diagnoses and all orders for this visit:  Bipolar II disorder ([place])  PTSD (post-traumatic stress disorder)  Generalized anxiety disorder  Major depressive disorder, recurrent episode, moderate (HCC)  Attention deficit hyperactivity disorder (ADHD), unspecified ADHD type  Insomnia, unspecified type    Please see After Visit Summary for patient specific instructions.  No future appointments.  No orders of the defined types were placed in this encounter.     -------------------------------

## 2019-08-19 ENCOUNTER — Telehealth: Payer: Self-pay | Admitting: Pulmonary Disease

## 2019-08-19 ENCOUNTER — Encounter: Payer: Self-pay | Admitting: Pulmonary Disease

## 2019-08-19 NOTE — Telephone Encounter (Signed)
I have printed a letter for him.

## 2019-08-19 NOTE — Telephone Encounter (Signed)
LMTCB x1.  It looks like Dr. Halford Chessman wrote a letter for patient on 08/13/19 and it was mailed to him. Not sure what other documentation he needs.

## 2019-08-19 NOTE — Telephone Encounter (Signed)
Patient is returning phone call.  Patient phone number is [phone].

## 2019-08-19 NOTE — Telephone Encounter (Signed)
Letter has been retrieved from Dr. Halford Chessman. It has been placed in the mail per the pt's request. Pt is aware. Nothing further was needed.

## 2019-08-19 NOTE — Telephone Encounter (Signed)
ATC patient unable to reach LM to call back office (x2) 

## 2019-08-21 NOTE — Telephone Encounter (Signed)
ATC pt, no answer. Left message for pt to call back.  

## 2019-08-22 NOTE — Telephone Encounter (Signed)
Will close encounter due to multiple attempts to reach pt, per triage protocal.    ATC pt, no answer. Left message for pt to call back.

## 2019-09-08 ENCOUNTER — Ambulatory Visit: Admitting: Adult Health

## 2019-09-15 ENCOUNTER — Ambulatory Visit (INDEPENDENT_AMBULATORY_CARE_PROVIDER_SITE_OTHER): Admitting: Adult Health

## 2019-09-15 ENCOUNTER — Encounter: Payer: Self-pay | Admitting: Adult Health

## 2019-09-15 ENCOUNTER — Other Ambulatory Visit: Payer: Self-pay

## 2019-09-15 DIAGNOSIS — F3181 Bipolar II disorder: Secondary | ICD-10-CM

## 2019-09-15 DIAGNOSIS — F4310 Post-traumatic stress disorder, unspecified: Secondary | ICD-10-CM

## 2019-09-15 DIAGNOSIS — F411 Generalized anxiety disorder: Secondary | ICD-10-CM

## 2019-09-15 DIAGNOSIS — F909 Attention-deficit hyperactivity disorder, unspecified type: Secondary | ICD-10-CM

## 2019-09-15 DIAGNOSIS — G4700 Insomnia, unspecified: Secondary | ICD-10-CM

## 2019-09-15 DIAGNOSIS — F331 Major depressive disorder, recurrent, moderate: Secondary | ICD-10-CM

## 2019-09-15 NOTE — Progress Notes (Signed)
Antonio [name]:[id number] [birth year] 34 y.o.  Subjective:   Patient ID:  Antonio [name] is a 34 y.o. (DOB 04-21-1986) male.  Chief Complaint:  Chief Complaint  Patient presents with  . Anxiety  . Depression  . ADHD  . Insomnia  . Trauma    PTSD  . Other    HPI   Antonio [name] presents to the office today for follow-up of anxiety, depression, insomnia, ADHD and PTSD.  Describes mood today as "ok". Pleasant. Mood symptoms - decreased anxiety, depression, and irritability. Stating "I am doing much better now that I can get out and do things". Remains out of work - recovering. Planning to return to work in March. Has started journaling and writing things down - "I feel better about that". Getting out more - "I'm trying". Plans to see a therapist - "I want to find out what makes me tick". He and wife doing well. Stable interest and motivation. Taking medications as prescribed.  Energy levels stable. Active, does not have a regular exercise routine. Works full-time at [employer] - out on disability until March 2021.  Enjoys some usual interests and activities. Married. Lives with wife and dog - "Lyala". Family local.  Appetite stable. Weight stable. Sleeps well most nights. Averages 6 to 8 hours with Trazadone.  Focus and concentration stable. Completing tasks. Managing aspects of household.  Denies SI or HI. Denies AH or VH.    PHQ2-9     Office Visit from 12/24/2013 in [place] Visit from 10/09/2013 in [place]  PHQ-2 Total Score  0  0       Review of Systems:  Review of Systems  Musculoskeletal: Negative for gait problem.  Neurological: Negative for tremors.  Psychiatric/Behavioral:       Please refer to HPI    Medications: I have reviewed the patient's current medications.  Current Outpatient Medications  Medication Sig Dispense Refill  . amphetamine-dextroamphetamine (ADDERALL XR) 20 MG 24 hr capsule  Take 40 mg by mouth daily.    Marland Kitchen aspirin 81 MG EC tablet TAKE 1 TABLET BY MOUTH TWICE DAILY    . Aspirin-Salicylamide-Caffeine (BC FAST PAIN RELIEF) 650-195-33.3 MG PACK Take 1-2 Packages by mouth every 6 (six) hours as needed (pain).    Marland Kitchen buPROPion (WELLBUTRIN XL) 150 MG 24 hr tablet Take 3 tablets (450 mg total) by mouth every morning. 90 tablet 5  . escitalopram (LEXAPRO) 20 MG tablet Take 1 tablet (20 mg total) by mouth daily. 30 tablet 5  . pantoprazole (PROTONIX) 40 MG tablet Take 1 tablet (40 mg total) by mouth daily. 30 tablet 3  . traZODone (DESYREL) 50 MG tablet Take 1 tablet (50 mg total) by mouth at bedtime. 60 tablet 5   No current facility-administered medications for this visit.    Medication Side Effects: None  Allergies: No Known Allergies  Past Medical History:  Diagnosis Date  . GERD (gastroesophageal reflux disease)   . HLD (hyperlipidemia)   . Vitamin D deficiency     No family history on file.  Social History   Socioeconomic History  . Marital status: Married    Spouse name: Not on file  . Number of children: Not on file  . Years of education: Not on file  . Highest education level: Not on file  Occupational History  . Occupation: Build guns  Tobacco Use  . Smoking status: Never Smoker  . Smokeless tobacco: Never Used  Substance and  Sexual Activity  . Alcohol use: Yes    Alcohol/week: 0.0 standard drinks    Comment: 3-4 beers daily  . Drug use: No  . Sexual activity: Not on file  Other Topics Concern  . Not on file  Social History Narrative  . Not on file   Social Determinants of Health   Financial Resource Strain:   . Difficulty of Paying Living Expenses: Not on file  Food Insecurity:   . Worried About Charity fundraiser in the Last Year: Not on file  . Ran Out of Food in the Last Year: Not on file  Transportation Needs:   . Lack of Transportation (Medical): Not on file  . Lack of Transportation (Non-Medical): Not on file  Physical  Activity:   . Days of Exercise per Week: Not on file  . Minutes of Exercise per Session: Not on file  Stress:   . Feeling of Stress : Not on file  Social Connections:   . Frequency of Communication with Friends and Family: Not on file  . Frequency of Social Gatherings with Friends and Family: Not on file  . Attends Religious Services: Not on file  . Active Member of Clubs or Organizations: Not on file  . Attends Archivist Meetings: Not on file  . Marital Status: Not on file  Intimate Partner Violence:   . Fear of Current or Ex-Partner: Not on file  . Emotionally Abused: Not on file  . Physically Abused: Not on file  . Sexually Abused: Not on file    Past Medical History, Surgical history, Social history, and Family history were reviewed and updated as appropriate.   Please see review of systems for further details on the patient's review from today.   Objective:   Physical Exam:  There were no vitals taken for this visit.  Physical Exam Constitutional:      General: He is not in acute distress.    Appearance: He is well-developed.  Musculoskeletal:        General: No deformity.  Neurological:     Mental Status: He is alert and oriented to person, place, and time.     Coordination: Coordination normal.  Psychiatric:        Attention and Perception: Attention and perception normal. He does not perceive auditory or visual hallucinations.        Mood and Affect: Mood normal. Mood is not anxious or depressed. Affect is not labile, blunt, angry or inappropriate.        Speech: Speech normal.        Behavior: Behavior normal.        Thought Content: Thought content normal. Thought content is not paranoid or delusional. Thought content does not include homicidal or suicidal ideation. Thought content does not include homicidal or suicidal plan.        Cognition and Memory: Cognition and memory normal.        Judgment: Judgment normal.     Comments: Insight intact      Lab Review:     Component Value Date/Time   NA 137 02/22/2019 0239   NA 142 02/16/2014 1501   K 3.9 02/22/2019 0239   CL 103 02/22/2019 0239   CO2 25 02/22/2019 0239   GLUCOSE 82 02/22/2019 0239   BUN 23 (H) 02/22/2019 0239   BUN 13 02/16/2014 1501   CREATININE 1.14 02/22/2019 0239   CALCIUM 8.9 02/22/2019 0239   PROT 7.2 02/22/2019 0239   PROT 6.6 02/16/2014  1501   ALBUMIN 4.5 02/22/2019 0239   ALBUMIN 4.9 02/16/2014 1501   AST 30 02/22/2019 0239   ALT 23 02/22/2019 0239   ALKPHOS 80 02/22/2019 0239   BILITOT 0.6 02/22/2019 0239   GFRNONAA >60 02/22/2019 0239   GFRAA >60 02/22/2019 0239       Component Value Date/Time   WBC 7.4 02/22/2019 0239   RBC 4.95 02/22/2019 0239   HGB 14.5 02/22/2019 0239   HCT 43.2 02/22/2019 0239   PLT 343 02/22/2019 0239   MCV 87.3 02/22/2019 0239   MCV 86.9 08/01/2013 1040   MCH 29.3 02/22/2019 0239   MCHC 33.6 02/22/2019 0239   RDW 11.8 02/22/2019 0239    No results found for: POCLITH, LITHIUM   No results found for: PHENYTOIN, PHENOBARB, VALPROATE, CBMZ   .res Assessment: Plan:    Plan:  Trazadone 50mg  - 1 to 2 at hs Lexapro 20mg  daily Wellbutrin XL 450mg    Latuda 20mg  daily - discontinued per patient  Taking ADHD medications from [facility]  RTC 6 months  Patient advised to contact office with any questions, adverse effects, or acute worsening in signs and symptoms.  Elder was seen today for anxiety, depression, adhd, insomnia, trauma and other.  Diagnoses and all orders for this visit:  Bipolar II disorder ([place])  PTSD (post-traumatic stress disorder)  Generalized anxiety disorder  Major depressive disorder, recurrent episode, moderate (HCC)  Attention deficit hyperactivity disorder (ADHD), unspecified ADHD type  Insomnia, unspecified type     Please see After Visit Summary for patient specific instructions.  No future appointments.  No orders of the defined types were placed in this  encounter.   -------------------------------

## 2019-12-08 ENCOUNTER — Ambulatory Visit: Admitting: Adult Health

## 2020-03-01 ENCOUNTER — Telehealth: Payer: Self-pay | Admitting: Adult Health

## 2020-03-01 ENCOUNTER — Other Ambulatory Visit: Payer: Self-pay

## 2020-03-01 DIAGNOSIS — G4700 Insomnia, unspecified: Secondary | ICD-10-CM

## 2020-03-01 MED ORDER — TRAZODONE HCL 50 MG PO TABS: 50.0000 mg | ORAL_TABLET | Freq: Every day | ORAL | 0 refills | Status: DC

## 2020-03-01 NOTE — Telephone Encounter (Signed)
#  30 sent for his Trazodone.

## 2020-03-01 NOTE — Telephone Encounter (Signed)
Antonio [name] called for refill on Trazadone. He has an appt scheduled for tomorrow at 5:20 pm. [facility] in [place].

## 2020-03-02 ENCOUNTER — Ambulatory Visit: Admitting: Adult Health

## 2020-03-08 ENCOUNTER — Encounter: Payer: Self-pay | Admitting: Adult Health

## 2020-03-08 ENCOUNTER — Other Ambulatory Visit: Payer: Self-pay

## 2020-03-08 ENCOUNTER — Ambulatory Visit (INDEPENDENT_AMBULATORY_CARE_PROVIDER_SITE_OTHER): Admitting: Adult Health

## 2020-03-08 ENCOUNTER — Encounter (INDEPENDENT_AMBULATORY_CARE_PROVIDER_SITE_OTHER): Payer: Self-pay

## 2020-03-08 VITALS — BP 107/68 | HR 90 | Ht 67.0 in | Wt 140.0 lb

## 2020-03-08 DIAGNOSIS — F4310 Post-traumatic stress disorder, unspecified: Secondary | ICD-10-CM

## 2020-03-08 DIAGNOSIS — F909 Attention-deficit hyperactivity disorder, unspecified type: Secondary | ICD-10-CM | POA: Diagnosis not present

## 2020-03-08 DIAGNOSIS — F331 Major depressive disorder, recurrent, moderate: Secondary | ICD-10-CM | POA: Diagnosis not present

## 2020-03-08 DIAGNOSIS — F411 Generalized anxiety disorder: Secondary | ICD-10-CM | POA: Diagnosis not present

## 2020-03-08 DIAGNOSIS — G4700 Insomnia, unspecified: Secondary | ICD-10-CM

## 2020-03-08 MED ORDER — AMPHETAMINE-DEXTROAMPHET ER 20 MG PO CP24: 20.0000 mg | ORAL_CAPSULE | Freq: Every day | ORAL | 0 refills | Status: DC

## 2020-03-08 MED ORDER — AMPHETAMINE-DEXTROAMPHET ER 20 MG PO CP24: 40.0000 mg | ORAL_CAPSULE | Freq: Every day | ORAL | 0 refills | Status: DC

## 2020-03-08 MED ORDER — TRAZODONE HCL 100 MG PO TABS: 100.0000 mg | ORAL_TABLET | Freq: Every day | ORAL | 5 refills | Status: DC

## 2020-03-08 MED ORDER — ESCITALOPRAM OXALATE 20 MG PO TABS: 20.0000 mg | ORAL_TABLET | Freq: Every day | ORAL | 5 refills | Status: DC

## 2020-03-08 MED ORDER — BUPROPION HCL ER (XL) 150 MG PO TB24: 450.0000 mg | ORAL_TABLET | ORAL | 5 refills | Status: DC

## 2020-03-08 NOTE — Progress Notes (Signed)
Antonio [name] [id number] 27-Oct-1985 34 y.o.  Subjective:   Patient ID:  Antonio [name] is a 34 y.o. (DOB 11-11-1985) male.  Chief Complaint: No chief complaint on file.   HPI Antonio [name] presents to the office today for follow-up of anxiety, depression, insomnia, ADHD and PTSD.  Describes mood today as "ok". Pleasant. Mood symptoms - denies anxiety, depression, and irritability. Stating "I'm doing good". Going to physical therapy for left ankle. He and wife doing well. Stable interest and motivation. Taking medications as prescribed.  Energy levels stable. Active, does not have a regular exercise routine.  Enjoys some usual interests and activities. Married. Lives with wife of 8 years and dog - "Lyala". Family local.  Appetite stable. Weight stable - 140 pounds. Sleeps well most nights. Averages 6 to 8 hours with Trazadone.  Focus and concentration stable. Completing tasks. Managing aspects of household. Working at [employer] - 60 hours a week. Denies SI or HI. Denies AH or VH.    PHQ2-9     Office Visit from 12/24/2013 in [place] Visit from 10/09/2013 in [place]  PHQ-2 Total Score 0 0       Review of Systems:  Review of Systems  Musculoskeletal: Negative for gait problem.  Neurological: Negative for tremors.  Psychiatric/Behavioral:       Please refer to HPI    Medications: I have reviewed the patient's current medications.  Current Outpatient Medications  Medication Sig Dispense Refill  . amphetamine-dextroamphetamine (ADDERALL XR) 20 MG 24 hr capsule Take 2 capsules (40 mg total) by mouth daily. 60 capsule 0  . [START ON 04/05/2020] amphetamine-dextroamphetamine (ADDERALL XR) 20 MG 24 hr capsule Take 1 capsule (20 mg total) by mouth daily. 60 capsule 0  . [START ON 05/03/2020] amphetamine-dextroamphetamine (ADDERALL XR) 20 MG 24 hr capsule Take 1 capsule (20 mg total) by mouth daily. 60 capsule 0   . aspirin 81 MG EC tablet TAKE 1 TABLET BY MOUTH TWICE DAILY    . Aspirin-Salicylamide-Caffeine (BC FAST PAIN RELIEF) 650-195-33.3 MG PACK Take 1-2 Packages by mouth every 6 (six) hours as needed (pain).    Marland Kitchen buPROPion (WELLBUTRIN XL) 150 MG 24 hr tablet Take 3 tablets (450 mg total) by mouth every morning. 90 tablet 5  . escitalopram (LEXAPRO) 20 MG tablet Take 1 tablet (20 mg total) by mouth daily. 30 tablet 5  . pantoprazole (PROTONIX) 40 MG tablet Take 1 tablet (40 mg total) by mouth daily. 30 tablet 3  . traZODone (DESYREL) 100 MG tablet Take 1 tablet (100 mg total) by mouth at bedtime. 30 tablet 5   No current facility-administered medications for this visit.    Medication Side Effects: None  Allergies: No Known Allergies  Past Medical History:  Diagnosis Date  . GERD (gastroesophageal reflux disease)   . HLD (hyperlipidemia)   . Vitamin D deficiency     No family history on file.  Social History   Socioeconomic History  . Marital status: Married    Spouse name: Not on file  . Number of children: Not on file  . Years of education: Not on file  . Highest education level: Not on file  Occupational History  . Occupation: Build guns  Tobacco Use  . Smoking status: Never Smoker  . Smokeless tobacco: Never Used  Vaping Use  . Vaping Use: Never used  Substance and Sexual Activity  . Alcohol use: Yes    Alcohol/week: 0.0 standard drinks  Comment: 3-4 beers daily  . Drug use: No  . Sexual activity: Not on file  Other Topics Concern  . Not on file  Social History Narrative  . Not on file   Social Determinants of Health   Financial Resource Strain:   . Difficulty of Paying Living Expenses:   Food Insecurity:   . Worried About Charity fundraiser in the Last Year:   . Arboriculturist in the Last Year:   Transportation Needs:   . Film/video editor (Medical):   Marland Kitchen Lack of Transportation (Non-Medical):   Physical Activity:   . Days of Exercise per Week:   .  Minutes of Exercise per Session:   Stress:   . Feeling of Stress :   Social Connections:   . Frequency of Communication with Friends and Family:   . Frequency of Social Gatherings with Friends and Family:   . Attends Religious Services:   . Active Member of Clubs or Organizations:   . Attends Archivist Meetings:   Marland Kitchen Marital Status:   Intimate Partner Violence:   . Fear of Current or Ex-Partner:   . Emotionally Abused:   Marland Kitchen Physically Abused:   . Sexually Abused:     Past Medical History, Surgical history, Social history, and Family history were reviewed and updated as appropriate.   Please see review of systems for further details on the patient's review from today.   Objective:   Physical Exam:  There were no vitals taken for this visit.  Physical Exam Constitutional:      General: He is not in acute distress. Musculoskeletal:        General: No deformity.  Neurological:     Mental Status: He is alert and oriented to person, place, and time.     Coordination: Coordination normal.  Psychiatric:        Attention and Perception: Attention and perception normal. He does not perceive auditory or visual hallucinations.        Mood and Affect: Mood normal. Mood is not anxious or depressed. Affect is not labile, blunt, angry or inappropriate.        Speech: Speech normal.        Behavior: Behavior normal.        Thought Content: Thought content normal. Thought content is not paranoid or delusional. Thought content does not include homicidal or suicidal ideation. Thought content does not include homicidal or suicidal plan.        Cognition and Memory: Cognition and memory normal.        Judgment: Judgment normal.     Comments: Insight intact     Lab Review:     Component Value Date/Time   NA 137 02/22/2019 0239   NA 142 02/16/2014 1501   K 3.9 02/22/2019 0239   CL 103 02/22/2019 0239   CO2 25 02/22/2019 0239   GLUCOSE 82 02/22/2019 0239   BUN 23 (H) 02/22/2019  0239   BUN 13 02/16/2014 1501   CREATININE 1.14 02/22/2019 0239   CALCIUM 8.9 02/22/2019 0239   PROT 7.2 02/22/2019 0239   PROT 6.6 02/16/2014 1501   ALBUMIN 4.5 02/22/2019 0239   ALBUMIN 4.9 02/16/2014 1501   AST 30 02/22/2019 0239   ALT 23 02/22/2019 0239   ALKPHOS 80 02/22/2019 0239   BILITOT 0.6 02/22/2019 0239   GFRNONAA >60 02/22/2019 0239   GFRAA >60 02/22/2019 0239       Component Value Date/Time   WBC  7.4 02/22/2019 0239   RBC 4.95 02/22/2019 0239   HGB 14.5 02/22/2019 0239   HCT 43.2 02/22/2019 0239   PLT 343 02/22/2019 0239   MCV 87.3 02/22/2019 0239   MCV 86.9 08/01/2013 1040   MCH 29.3 02/22/2019 0239   MCHC 33.6 02/22/2019 0239   RDW 11.8 02/22/2019 0239    No results found for: POCLITH, LITHIUM   No results found for: PHENYTOIN, PHENOBARB, VALPROATE, CBMZ   .res Assessment: Plan:    Plan:  Trazadone 50mg  - 1 to 2 at hs Lexapro 20mg  daily Wellbutrin XL 450mg   Aderall XR 20mg  BID - will call in 3 months for refills  RTC 6 months  Patient advised to contact office with any questions, adverse effects, or acute worsening in signs and symptoms.  Discussed potential benefits, risks, and side effects of stimulants with patient to include increased heart rate, palpitations, insomnia, increased anxiety, increased irritability, or decreased appetite.  Instructed patient to contact office if experiencing any significant tolerability issues.   Diagnoses and all orders for this visit:  PTSD (post-traumatic stress disorder) -     amphetamine-dextroamphetamine (ADDERALL XR) 20 MG 24 hr capsule; Take 2 capsules (40 mg total) by mouth daily. -     amphetamine-dextroamphetamine (ADDERALL XR) 20 MG 24 hr capsule; Take 1 capsule (20 mg total) by mouth daily. -     amphetamine-dextroamphetamine (ADDERALL XR) 20 MG 24 hr capsule; Take 1 capsule (20 mg total) by mouth daily.  Generalized anxiety disorder -     escitalopram (LEXAPRO) 20 MG tablet; Take 1 tablet (20 mg  total) by mouth daily.  Major depressive disorder, recurrent episode, moderate (HCC) -     buPROPion (WELLBUTRIN XL) 150 MG 24 hr tablet; Take 3 tablets (450 mg total) by mouth every morning. -     escitalopram (LEXAPRO) 20 MG tablet; Take 1 tablet (20 mg total) by mouth daily.  Attention deficit hyperactivity disorder (ADHD), unspecified ADHD type -     buPROPion (WELLBUTRIN XL) 150 MG 24 hr tablet; Take 3 tablets (450 mg total) by mouth every morning.  Insomnia, unspecified type -     traZODone (DESYREL) 100 MG tablet; Take 1 tablet (100 mg total) by mouth at bedtime.     Please see After Visit Summary for patient specific instructions.  No future appointments.  No orders of the defined types were placed in this encounter.   -------------------------------

## 2020-06-07 ENCOUNTER — Ambulatory Visit: Admitting: Adult Health

## 2020-06-29 ENCOUNTER — Other Ambulatory Visit: Payer: Self-pay

## 2020-06-29 ENCOUNTER — Telehealth: Payer: Self-pay | Admitting: Adult Health

## 2020-06-29 DIAGNOSIS — F4310 Post-traumatic stress disorder, unspecified: Secondary | ICD-10-CM

## 2020-06-29 NOTE — Telephone Encounter (Signed)
Last refill 05/08/20, patient may have 1 Rx on file. Will contact pharmacy to confirm.  Rx's pended for Barnett Applebaum to review and send

## 2020-06-29 NOTE — Telephone Encounter (Signed)
Patient has an appt on 09/13/20. Requesting refill on Adderall 40 mg called to [facility] in [place], [place].

## 2020-06-30 ENCOUNTER — Telehealth: Payer: Self-pay | Admitting: Adult Health

## 2020-06-30 MED ORDER — AMPHETAMINE-DEXTROAMPHET ER 20 MG PO CP24: 20.0000 mg | ORAL_CAPSULE | Freq: Every day | ORAL | 0 refills | Status: DC

## 2020-06-30 MED ORDER — AMPHETAMINE-DEXTROAMPHET ER 20 MG PO CP24: 40.0000 mg | ORAL_CAPSULE | Freq: Every day | ORAL | 0 refills | Status: DC

## 2020-06-30 NOTE — Telephone Encounter (Signed)
Pharmacy called asking if they can get clarification on the rx for Adderall that was sent in today. Please call Gabriel Cirri at [phone]

## 2020-07-01 ENCOUNTER — Other Ambulatory Visit: Payer: Self-pay

## 2020-07-01 DIAGNOSIS — F4310 Post-traumatic stress disorder, unspecified: Secondary | ICD-10-CM

## 2020-07-01 MED ORDER — AMPHETAMINE-DEXTROAMPHET ER 20 MG PO CP24: 20.0000 mg | ORAL_CAPSULE | Freq: Two times a day (BID) | ORAL | 0 refills | Status: DC

## 2020-07-01 NOTE — Telephone Encounter (Signed)
All 3 Rx's updated with Adderall 20 mg bid #60   Pended for Gina to resend with clarification

## 2020-08-31 ENCOUNTER — Ambulatory Visit (INDEPENDENT_AMBULATORY_CARE_PROVIDER_SITE_OTHER): Admitting: Adult Health

## 2020-08-31 ENCOUNTER — Other Ambulatory Visit: Payer: Self-pay

## 2020-08-31 ENCOUNTER — Encounter: Payer: Self-pay | Admitting: Adult Health

## 2020-08-31 DIAGNOSIS — F411 Generalized anxiety disorder: Secondary | ICD-10-CM

## 2020-08-31 DIAGNOSIS — F331 Major depressive disorder, recurrent, moderate: Secondary | ICD-10-CM

## 2020-08-31 DIAGNOSIS — F909 Attention-deficit hyperactivity disorder, unspecified type: Secondary | ICD-10-CM | POA: Diagnosis not present

## 2020-08-31 DIAGNOSIS — F4310 Post-traumatic stress disorder, unspecified: Secondary | ICD-10-CM | POA: Diagnosis not present

## 2020-08-31 DIAGNOSIS — G4700 Insomnia, unspecified: Secondary | ICD-10-CM

## 2020-08-31 MED ORDER — AMPHETAMINE-DEXTROAMPHET ER 20 MG PO CP24: 20.0000 mg | ORAL_CAPSULE | Freq: Two times a day (BID) | ORAL | 0 refills | Status: DC

## 2020-08-31 MED ORDER — BUPROPION HCL ER (XL) 150 MG PO TB24: 450.0000 mg | ORAL_TABLET | ORAL | 5 refills | Status: DC

## 2020-08-31 MED ORDER — ESCITALOPRAM OXALATE 20 MG PO TABS: 20.0000 mg | ORAL_TABLET | Freq: Every day | ORAL | 5 refills | Status: DC

## 2020-08-31 MED ORDER — TRAZODONE HCL 100 MG PO TABS: 100.0000 mg | ORAL_TABLET | Freq: Every day | ORAL | 5 refills | Status: DC

## 2020-08-31 NOTE — Progress Notes (Signed)
Antonio [name] [id number] [birth year] 35 y.o.  Subjective:   Patient ID:  Antonio [name] is a 35 y.o. (DOB 1986/05/25) male.  Chief Complaint: No chief complaint on file.   HPI Antonio [name] presents to the office today for follow-up of anxiety, depression, insomnia, ADHD and PTSD.  Describes mood today as "ok". Pleasant. Mood symptoms - denies depression and irritability. Feels anxious in the work setting. Stating "I'm doing pretty good". Plans to restart physical therapy for left ankle. He and wife doing well. Stable interest and motivation. Taking medications as prescribed.  Energy levels lower. Active, does not have a regular exercise routine.  Enjoys some usual interests and activities. Cooking more. Married. Lives with wife and dog - "[name]". Family local.  Appetite stable. Weight stable - 140 pounds. Sleeps well most nights. Averages 6 to 8 hours with Trazadone.  Focus and concentration stable. Completing tasks. Managing aspects of household. Working full time - new job - 36/[employer]. Has drill once a month.  hours a week. Denies SI or HI.  Denies AH or VH.     [place] Office Visit from 12/24/2013 in [facility] Visit from 10/09/2013 in [facility]  PHQ-2 Total Score 0 0       Review of Systems:  Review of Systems  Musculoskeletal: Negative for gait problem.  Neurological: Negative for tremors.  Psychiatric/Behavioral:       Please refer to HPI    Medications: I have reviewed the patient's current medications.  Current Outpatient Medications  Medication Sig Dispense Refill  . amphetamine-dextroamphetamine (ADDERALL XR) 20 MG 24 hr capsule Take 1 capsule (20 mg total) by mouth 2 (two) times daily. 60 capsule 0  . [START ON 09/28/2020] amphetamine-dextroamphetamine (ADDERALL XR) 20 MG 24 hr capsule Take 1 capsule (20 mg total) by mouth 2 (two) times daily. 60 capsule 0  . [START ON 10/26/2020]  amphetamine-dextroamphetamine (ADDERALL XR) 20 MG 24 hr capsule Take 1 capsule (20 mg total) by mouth 2 (two) times daily. 60 capsule 0  . aspirin 81 MG EC tablet TAKE 1 TABLET BY MOUTH TWICE DAILY    . Aspirin-Salicylamide-Caffeine (BC FAST PAIN RELIEF) 650-195-33.3 MG PACK Take 1-2 Packages by mouth every 6 (six) hours as needed (pain).    Marland Kitchen buPROPion (WELLBUTRIN XL) 150 MG 24 hr tablet Take 3 tablets (450 mg total) by mouth every morning. 90 tablet 5  . escitalopram (LEXAPRO) 20 MG tablet Take 1 tablet (20 mg total) by mouth daily. 30 tablet 5  . pantoprazole (PROTONIX) 40 MG tablet Take 1 tablet (40 mg total) by mouth daily. 30 tablet 3  . traZODone (DESYREL) 100 MG tablet Take 1 tablet (100 mg total) by mouth at bedtime. 30 tablet 5   No current facility-administered medications for this visit.    Medication Side Effects: None  Allergies: No Known Allergies  Past Medical History:  Diagnosis Date  . GERD (gastroesophageal reflux disease)   . HLD (hyperlipidemia)   . Vitamin D deficiency     No family history on file.  Social History   Socioeconomic History  . Marital status: Married    Spouse name: Not on file  . Number of children: Not on file  . Years of education: Not on file  . Highest education level: Not on file  Occupational History  . Occupation: Build guns  Tobacco Use  . Smoking status: Never Smoker  . Smokeless tobacco: Never Used  Vaping  Use  . Vaping Use: Never used  Substance and Sexual Activity  . Alcohol use: Yes    Alcohol/week: 0.0 standard drinks    Comment: 3-4 beers daily  . Drug use: No  . Sexual activity: Not on file  Other Topics Concern  . Not on file  Social History Narrative  . Not on file   Social Determinants of Health   Financial Resource Strain: Not on file  Food Insecurity: Not on file  Transportation Needs: Not on file  Physical Activity: Not on file  Stress: Not on file  Social Connections: Not on file  Intimate Partner  Violence: Not on file    Past Medical History, Surgical history, Social history, and Family history were reviewed and updated as appropriate.   Please see review of systems for further details on the patient's review from today.   Objective:   Physical Exam:  There were no vitals taken for this visit.  Physical Exam Constitutional:      General: He is not in acute distress. Musculoskeletal:        General: No deformity.  Neurological:     Mental Status: He is alert and oriented to person, place, and time.     Coordination: Coordination normal.  Psychiatric:        Attention and Perception: Attention and perception normal. He does not perceive auditory or visual hallucinations.        Mood and Affect: Mood normal. Mood is not anxious or depressed. Affect is not labile, blunt, angry or inappropriate.        Speech: Speech normal.        Behavior: Behavior normal.        Thought Content: Thought content normal. Thought content is not paranoid or delusional. Thought content does not include homicidal or suicidal ideation. Thought content does not include homicidal or suicidal plan.        Cognition and Memory: Cognition and memory normal.        Judgment: Judgment normal.     Comments: Insight intact     Lab Review:     Component Value Date/Time   NA 137 02/22/2019 0239   NA 142 02/16/2014 1501   K 3.9 02/22/2019 0239   CL 103 02/22/2019 0239   CO2 25 02/22/2019 0239   GLUCOSE 82 02/22/2019 0239   BUN 23 (H) 02/22/2019 0239   BUN 13 02/16/2014 1501   CREATININE 1.14 02/22/2019 0239   CALCIUM 8.9 02/22/2019 0239   PROT 7.2 02/22/2019 0239   PROT 6.6 02/16/2014 1501   ALBUMIN 4.5 02/22/2019 0239   ALBUMIN 4.9 02/16/2014 1501   AST 30 02/22/2019 0239   ALT 23 02/22/2019 0239   ALKPHOS 80 02/22/2019 0239   BILITOT 0.6 02/22/2019 0239   GFRNONAA >60 02/22/2019 0239   GFRAA >60 02/22/2019 0239       Component Value Date/Time   WBC 7.4 02/22/2019 0239   RBC 4.95  02/22/2019 0239   HGB 14.5 02/22/2019 0239   HCT 43.2 02/22/2019 0239   PLT 343 02/22/2019 0239   MCV 87.3 02/22/2019 0239   MCV 86.9 08/01/2013 1040   MCH 29.3 02/22/2019 0239   MCHC 33.6 02/22/2019 0239   RDW 11.8 02/22/2019 0239    No results found for: POCLITH, LITHIUM   No results found for: PHENYTOIN, PHENOBARB, VALPROATE, CBMZ   .res Assessment: Plan:    Plan:  Trazadone 50mg  - 1 to 2 at hs Lexapro 20mg  daily Wellbutrin XL 450mg   Aderall XR 20mg  BID - will call in 3 months for refills  95/60 - 83  RTC 6 months  Patient advised to contact office with any questions, adverse effects, or acute worsening in signs and symptoms.  Discussed potential benefits, risks, and side effects of stimulants with patient to include increased heart rate, palpitations, insomnia, increased anxiety, increased irritability, or decreased appetite.  Instructed patient to contact office if experiencing any significant tolerability issues.   Diagnoses and all orders for this visit:  PTSD (post-traumatic stress disorder) -     amphetamine-dextroamphetamine (ADDERALL XR) 20 MG 24 hr capsule; Take 1 capsule (20 mg total) by mouth 2 (two) times daily. -     amphetamine-dextroamphetamine (ADDERALL XR) 20 MG 24 hr capsule; Take 1 capsule (20 mg total) by mouth 2 (two) times daily. -     amphetamine-dextroamphetamine (ADDERALL XR) 20 MG 24 hr capsule; Take 1 capsule (20 mg total) by mouth 2 (two) times daily.  Insomnia, unspecified type -     traZODone (DESYREL) 100 MG tablet; Take 1 tablet (100 mg total) by mouth at bedtime.  Major depressive disorder, recurrent episode, moderate (HCC) -     buPROPion (WELLBUTRIN XL) 150 MG 24 hr tablet; Take 3 tablets (450 mg total) by mouth every morning. -     escitalopram (LEXAPRO) 20 MG tablet; Take 1 tablet (20 mg total) by mouth daily.  Attention deficit hyperactivity disorder (ADHD), unspecified ADHD type -     buPROPion (WELLBUTRIN XL) 150 MG 24 hr  tablet; Take 3 tablets (450 mg total) by mouth every morning.  Generalized anxiety disorder -     escitalopram (LEXAPRO) 20 MG tablet; Take 1 tablet (20 mg total) by mouth daily.     Please see After Visit Summary for patient specific instructions.  Future Appointments  Date Time Provider [place]  09/01/2020 11:15 AM Alric Quan D, RD [place] [facility]    No orders of the defined types were placed in this encounter.   -------------------------------

## 2020-09-01 ENCOUNTER — Encounter: Payer: Self-pay | Admitting: Registered"

## 2020-09-01 ENCOUNTER — Encounter: Attending: Physician Assistant | Admitting: Registered"

## 2020-09-01 ENCOUNTER — Other Ambulatory Visit: Payer: Self-pay

## 2020-09-01 DIAGNOSIS — R627 Adult failure to thrive: Secondary | ICD-10-CM | POA: Insufficient documentation

## 2020-09-01 NOTE — Patient Instructions (Addendum)
Instructions/Goals:   Make sure to get in three meals per day. Try to have balanced meals like the My Plate example (see handout). Include lean proteins, vegetables, fruits, and whole grains at meals.   Schedule:   Eat first meal within 1 hour of waking and then every 3-5 hours during day while awake.   Great that you're wanting to cook more! Recommend trying out steamable vegetables and stir frying on the stove top. Recommend checking out recipe website:   SecurityAd.es  Try adding protein with your fruit for snacks  Water Goal: 64 oz Try to work on reducing amount of flavoring you add to your water.

## 2020-09-01 NOTE — Progress Notes (Signed)
Medical Nutrition Therapy:  Appt start time: 1119 end time:  1230.  Assessment:  Primary concerns today: Pt referred due to FTT/poor eating habits. Pt present for appointment alone.  Pt reports he doesn't have a wt problem or diabetes but just wants to learn more about nutrition. Reports he would like to eat a of real food instead of "junk." Pt reports he has been trying to cook more. Has made spaghetti with marinara sauce, hamburger patty and rice on stove top, and beef stew in pressure cooker at home. Reports he is trying to take things one step at a time with making these changes. Reports he would like to eat more plant based because he has heard it is healthier. Reports once he gets into cooking more would like to try to meal plan.   Pt wants to know if it is ok for him to eat 2 cereal bowls of fruit each day. He also would like to know if it is ok for him to put salad dressing on his salad.   Pt works at a [employer] in [place], 6 PM to 6 AM.   Food Allergies/Intolerances: N/A  GI Concerns: None reported.  Pertinent Lab Values: N/A  Preferred Learning Style:   No preference indicated   Learning Readiness:   Ready  MEDICATIONS: See list. Reviewed.    DIETARY INTAKE:  Usual eating pattern includes 2 meals and 2-3 snacks per day. Pt works at [employer] in [place]. Works 6 PM to 6 AM.   Common foods: can prepare beef stew and spaghetti with marinara sauce, breakfast is often grits. Avoided foods: pork other than bacon and bbq, beans, seafood.  Reports he doesn't usually eat salads but wants to try to more often. Wants to know if he can add salad dressing.   Typical Snacks: chips, swiss roll, gushers, pistachios.  Typical Beverages: soda, Crystal Lite (unsure of amount, at least 32 oz).  Location of Meals: living room.    Electronics Present at [employer]: Sometimes: TV  Preferred/Accepted Foods:  Grains/Starches: most Proteins: most  Vegetables: most-carrots, green  beans, potatoes Fruits: apples, pineapple, watermelon, bananas, most Dairy: milk with cereal, cheese, has not tried yogurt before  Sauces/Dips/Spreads: Beverages: soda, flavored water  Other:  24-hr recall:  Snk (AM): Pringles, Mountain Dew B ( AM): bacon biscuit, Mountain Dew  Snk ( AM): None reported.  L (12 PM): Chick Fil A grilled sandwich tomato, lettuce, fries, lemonade Snk ( PM): None reported.  D ( PM): None reported.  Snk ( PM): None reported.  Beverages: soda, lemonade.   Usual physical activity: push ups x 35 on most days. Reports his job is not very physical.    Estimated energy needs: 2568 calories 289-417g carbohydrates 55 g protein 57-100 g fat  Progress Towards Goal(s):  In progress.   Nutritional Diagnosis:  NI-5.11.1 Predicted suboptimal nutrient intake As related to skipping meals, high intake of soda.  As evidenced by pt's reported dietary recall and habits.    Intervention:  Nutrition counseling provided. Dietitian provided education regarding balanced nutrition and importance of eating consistently. Praised pt for working on healthy eating habits. Discussed additional dishes to try preparing at home. Discussed steamable vegetable packs and also stir frying on stove as pt reports he has pan fried beef in past. Went through My Plate online recipe database and steps included. Encouraged adding protein at snacks and discussed always having with meals as well. Discussed 2 bowls of fruit is ok as long as  it is not causing pt to not eat other food groups at meals. Discussed dressing is ok on salad dressing as long as it is not excessive. Pt appeared agreeable to information/goals discussed.   Instructions/Goals:   Make sure to get in three meals per day. Try to have balanced meals like the My Plate example (see handout). Include lean proteins, vegetables, fruits, and whole grains at meals.   Schedule:   Eat first meal within 1 hour of waking and then every 3-5  hours during day while awake.   Great that you're wanting to cook more! Recommend trying out steamable vegetables and stir frying on the stove top. Recommend checking out recipe website:   SecurityAd.es  Try adding protein with your fruit for snacks  Water Goal: 64 oz Try to work on reducing amount of flavoring you add to your water.   Teaching Method Utilized:  Visual Auditory  Handouts given during visit include:  Balanced plate and food list.   Balanced snack sheet.   Barriers to learning/adherence to lifestyle change: None reported.   Demonstrated degree of understanding via:  Teach Back   Monitoring/Evaluation:  Dietary intake, exercise, and body weight in 2 month(s).

## 2020-09-13 ENCOUNTER — Ambulatory Visit: Admitting: Adult Health

## 2020-10-27 ENCOUNTER — Ambulatory Visit: Admitting: Registered"

## 2021-01-18 ENCOUNTER — Telehealth: Payer: Self-pay | Admitting: Adult Health

## 2021-01-18 ENCOUNTER — Other Ambulatory Visit: Payer: Self-pay

## 2021-01-18 DIAGNOSIS — F4310 Post-traumatic stress disorder, unspecified: Secondary | ICD-10-CM

## 2021-01-18 NOTE — Telephone Encounter (Signed)
Pended.

## 2021-01-18 NOTE — Telephone Encounter (Signed)
Pt would like a refill on Adderall. [facility] to [facility] in [place],

## 2021-01-19 MED ORDER — AMPHETAMINE-DEXTROAMPHET ER 20 MG PO CP24: 20.0000 mg | ORAL_CAPSULE | Freq: Two times a day (BID) | ORAL | 0 refills | Status: DC

## 2021-02-07 ENCOUNTER — Other Ambulatory Visit: Payer: Self-pay | Admitting: Adult Health

## 2021-02-07 DIAGNOSIS — G4700 Insomnia, unspecified: Secondary | ICD-10-CM

## 2021-02-10 ENCOUNTER — Telehealth: Payer: Self-pay | Admitting: Adult Health

## 2021-02-10 NOTE — Telephone Encounter (Signed)
Rx sent 

## 2021-02-10 NOTE — Telephone Encounter (Signed)
Next visit is 02/28/21. Requesting refill on Trazodone called to:  [id number] - [place], [place] [facility] [place]  Phone:  [phone]  Fax:  [phone]

## 2021-02-28 ENCOUNTER — Ambulatory Visit (INDEPENDENT_AMBULATORY_CARE_PROVIDER_SITE_OTHER): Admitting: Adult Health

## 2021-02-28 ENCOUNTER — Encounter: Payer: Self-pay | Admitting: Adult Health

## 2021-02-28 ENCOUNTER — Other Ambulatory Visit: Payer: Self-pay

## 2021-02-28 ENCOUNTER — Ambulatory Visit: Admitting: Adult Health

## 2021-02-28 DIAGNOSIS — G4700 Insomnia, unspecified: Secondary | ICD-10-CM

## 2021-02-28 DIAGNOSIS — F331 Major depressive disorder, recurrent, moderate: Secondary | ICD-10-CM

## 2021-02-28 DIAGNOSIS — F4310 Post-traumatic stress disorder, unspecified: Secondary | ICD-10-CM

## 2021-02-28 DIAGNOSIS — F909 Attention-deficit hyperactivity disorder, unspecified type: Secondary | ICD-10-CM

## 2021-02-28 DIAGNOSIS — F411 Generalized anxiety disorder: Secondary | ICD-10-CM

## 2021-02-28 MED ORDER — ESCITALOPRAM OXALATE 20 MG PO TABS: 20.0000 mg | ORAL_TABLET | Freq: Every day | ORAL | 1 refills | Status: DC

## 2021-02-28 MED ORDER — TRAZODONE HCL 100 MG PO TABS: ORAL_TABLET | ORAL | 1 refills | Status: DC

## 2021-02-28 MED ORDER — AMPHETAMINE-DEXTROAMPHET ER 20 MG PO CP24: 20.0000 mg | ORAL_CAPSULE | Freq: Two times a day (BID) | ORAL | 0 refills | Status: DC

## 2021-02-28 MED ORDER — BUPROPION HCL ER (XL) 150 MG PO TB24: 450.0000 mg | ORAL_TABLET | ORAL | 1 refills | Status: DC

## 2021-02-28 NOTE — Progress Notes (Signed)
Antonio [name] [id number] [birth year] 35 y.o.  Subjective:   Patient ID:  Antonio [name] is a 35 y.o. (DOB 1985-11-08) male.  Chief Complaint: No chief complaint on file.   HPI Antonio [name] presents to the office today for follow-up of anxiety, depression, insomnia, ADHD and PTSD.  Describes mood today as "ok". Pleasant. Mood symptoms - denies anxiety, depression and irritability. Stating "everything is going good". 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 Has been working 7 days a week. He and wife doing well. Stable interest and motivation. Taking medications as prescribed.  Energy levels lower. Active, does not have a regular exercise routine.  Enjoys some usual interests and activities. Married. Lives with wife and dog - "[name]". Family local.  Appetite stable. Weight stable - 140 pounds. Sleeps well most nights. Averages 6 to 8 hours with Trazadone.  Focus and concentration stable. Completing tasks. Managing aspects of household. Working full time. Taking a year of - inactive duty. Has drill once a month.  Denies SI or HI.  Denies AH or VH.    PHQ2-9    Flowsheet Row Nutrition [facility] Visit from 12/24/2013 in [facility] Visit from 10/09/2013 in [facility]  PHQ-2 Total Score 0 0 0        Review of Systems:  Review of Systems  Musculoskeletal:  Negative for gait problem.  Neurological:  Negative for tremors.  Psychiatric/Behavioral:         Please refer to HPI   Medications: I have reviewed the patient's current medications.  Current Outpatient Medications  Medication Sig Dispense Refill   amphetamine-dextroamphetamine (ADDERALL XR) 20 MG 24 hr capsule Take 1 capsule (20 mg total) by mouth 2 (two) times daily. 60 capsule 0   [START ON 03/28/2021] amphetamine-dextroamphetamine (ADDERALL XR) 20 MG 24 hr capsule Take 1 capsule (20 mg total) by mouth 2 (two)  times daily. 60 capsule 0   [START ON 04/25/2021] amphetamine-dextroamphetamine (ADDERALL XR) 20 MG 24 hr capsule Take 1 capsule (20 mg total) by mouth 2 (two) times daily. 60 capsule 0   aspirin 81 MG EC tablet TAKE 1 TABLET BY MOUTH TWICE DAILY     Aspirin-Salicylamide-Caffeine (BC FAST PAIN RELIEF) 650-195-33.3 MG PACK Take 1-2 Packages by mouth every 6 (six) hours as needed (pain).     buPROPion (WELLBUTRIN XL) 150 MG 24 hr tablet Take 3 tablets (450 mg total) by mouth every morning. 270 tablet 1   escitalopram (LEXAPRO) 20 MG tablet Take 1 tablet (20 mg total) by mouth daily. 90 tablet 1   pantoprazole (PROTONIX) 40 MG tablet Take 1 tablet (40 mg total) by mouth daily. 30 tablet 3   traZODone (DESYREL) 100 MG tablet TAKE 1 TABLET BY MOUTH EVERYDAY AT BEDTIME 90 tablet 1   No current facility-administered medications for this visit.    Medication Side Effects: None  Allergies: No Known Allergies  Past Medical History:  Diagnosis Date   GERD (gastroesophageal reflux disease)    HLD (hyperlipidemia)    Vitamin D deficiency     Past Medical History, Surgical history, Social history, and Family history were reviewed and updated as appropriate.   Please see review of systems for further details on the patient's review from today.   Objective:   Physical Exam:  There were no vitals taken for this visit.  Physical Exam Constitutional:      General: He is not in  acute distress. Musculoskeletal:        General: No deformity.  Neurological:     Mental Status: He is alert and oriented to person, place, and time.     Coordination: Coordination normal.  Psychiatric:        Attention and Perception: Attention and perception normal. He does not perceive auditory or visual hallucinations.        Mood and Affect: Mood normal. Mood is not anxious or depressed. Affect is not labile, blunt, angry or inappropriate.        Speech: Speech normal.        Behavior: Behavior normal.         Thought Content: Thought content normal. Thought content is not paranoid or delusional. Thought content does not include homicidal or suicidal ideation. Thought content does not include homicidal or suicidal plan.        Cognition and Memory: Cognition and memory normal.        Judgment: Judgment normal.     Comments: Insight intact    Lab Review:     Component Value Date/Time   NA 137 02/22/2019 0239   NA 142 02/16/2014 1501   K 3.9 02/22/2019 0239   CL 103 02/22/2019 0239   CO2 25 02/22/2019 0239   GLUCOSE 82 02/22/2019 0239   BUN 23 (H) 02/22/2019 0239   BUN 13 02/16/2014 1501   CREATININE 1.14 02/22/2019 0239   CALCIUM 8.9 02/22/2019 0239   PROT 7.2 02/22/2019 0239   PROT 6.6 02/16/2014 1501   ALBUMIN 4.5 02/22/2019 0239   ALBUMIN 4.9 02/16/2014 1501   AST 30 02/22/2019 0239   ALT 23 02/22/2019 0239   ALKPHOS 80 02/22/2019 0239   BILITOT 0.6 02/22/2019 0239   GFRNONAA >60 02/22/2019 0239   GFRAA >60 02/22/2019 0239       Component Value Date/Time   WBC 7.4 02/22/2019 0239   RBC 4.95 02/22/2019 0239   HGB 14.5 02/22/2019 0239   HCT 43.2 02/22/2019 0239   PLT 343 02/22/2019 0239   MCV 87.3 02/22/2019 0239   MCV 86.9 08/01/2013 1040   MCH 29.3 02/22/2019 0239   MCHC 33.6 02/22/2019 0239   RDW 11.8 02/22/2019 0239    No results found for: POCLITH, LITHIUM   No results found for: PHENYTOIN, PHENOBARB, VALPROATE, CBMZ   .res Assessment: Plan:     Plan:  Trazadone '50mg'$  - 1 to 2 at hs Lexapro '20mg'$  daily Wellbutrin XL '450mg'$   Aderall XR '20mg'$  BID - will call in 3 months for refills  96/53/84  RTC 6 months  Patient advised to contact office with any questions, adverse effects, or acute worsening in signs and symptoms.  Discussed potential benefits, risks, and side effects of stimulants with patient to include increased heart rate, palpitations, insomnia, increased anxiety, increased irritability, or decreased appetite.  Instructed patient to contact office if  experiencing any significant tolerability issues.     Diagnoses and all orders for this visit:  Insomnia, unspecified type -     traZODone (DESYREL) 100 MG tablet; TAKE 1 TABLET BY MOUTH EVERYDAY AT BEDTIME  Major depressive disorder, recurrent episode, moderate (HCC) -     escitalopram (LEXAPRO) 20 MG tablet; Take 1 tablet (20 mg total) by mouth daily. -     buPROPion (WELLBUTRIN XL) 150 MG 24 hr tablet; Take 3 tablets (450 mg total) by mouth every morning.  Generalized anxiety disorder -     escitalopram (LEXAPRO) 20 MG tablet; Take 1 tablet (20  mg total) by mouth daily.  Attention deficit hyperactivity disorder (ADHD), unspecified ADHD type -     buPROPion (WELLBUTRIN XL) 150 MG 24 hr tablet; Take 3 tablets (450 mg total) by mouth every morning.  PTSD (post-traumatic stress disorder) -     amphetamine-dextroamphetamine (ADDERALL XR) 20 MG 24 hr capsule; Take 1 capsule (20 mg total) by mouth 2 (two) times daily. -     amphetamine-dextroamphetamine (ADDERALL XR) 20 MG 24 hr capsule; Take 1 capsule (20 mg total) by mouth 2 (two) times daily. -     amphetamine-dextroamphetamine (ADDERALL XR) 20 MG 24 hr capsule; Take 1 capsule (20 mg total) by mouth 2 (two) times daily.    Please see After Visit Summary for patient specific instructions.  No future appointments.  No orders of the defined types were placed in this encounter.   -------------------------------

## 2021-08-31 ENCOUNTER — Ambulatory Visit: Admitting: Adult Health

## 2021-09-05 ENCOUNTER — Ambulatory Visit (INDEPENDENT_AMBULATORY_CARE_PROVIDER_SITE_OTHER): Payer: BC Managed Care – PPO | Admitting: Adult Health

## 2021-09-05 ENCOUNTER — Encounter: Payer: Self-pay | Admitting: Adult Health

## 2021-09-05 ENCOUNTER — Other Ambulatory Visit: Payer: Self-pay

## 2021-09-05 DIAGNOSIS — F909 Attention-deficit hyperactivity disorder, unspecified type: Secondary | ICD-10-CM

## 2021-09-05 DIAGNOSIS — F411 Generalized anxiety disorder: Secondary | ICD-10-CM

## 2021-09-05 DIAGNOSIS — F331 Major depressive disorder, recurrent, moderate: Secondary | ICD-10-CM

## 2021-09-05 DIAGNOSIS — F4310 Post-traumatic stress disorder, unspecified: Secondary | ICD-10-CM

## 2021-09-05 DIAGNOSIS — G4700 Insomnia, unspecified: Secondary | ICD-10-CM | POA: Diagnosis not present

## 2021-09-05 MED ORDER — AMPHETAMINE-DEXTROAMPHET ER 20 MG PO CP24: 20.0000 mg | ORAL_CAPSULE | Freq: Two times a day (BID) | ORAL | 0 refills | Status: DC

## 2021-09-05 MED ORDER — ESCITALOPRAM OXALATE 20 MG PO TABS: 20.0000 mg | ORAL_TABLET | Freq: Every day | ORAL | 1 refills | Status: DC

## 2021-09-05 MED ORDER — BUPROPION HCL ER (XL) 150 MG PO TB24: 450.0000 mg | ORAL_TABLET | ORAL | 1 refills | Status: DC

## 2021-09-05 MED ORDER — TRAZODONE HCL 100 MG PO TABS: ORAL_TABLET | ORAL | 1 refills | Status: DC

## 2021-09-05 MED ORDER — REXULTI 0.5 MG PO TABS: 0.5000 mg | ORAL_TABLET | Freq: Every day | ORAL | 2 refills | Status: DC

## 2021-09-05 NOTE — Progress Notes (Signed)
Antonio [name] [id number] 1986/01/11 36 y.o.  Subjective:   Patient ID:  Antonio [name] is a 36 y.o. (DOB Dec 14, 1985) male.  Chief Complaint: No chief complaint on file.   HPI Antonio [name] presents to the office today for follow-up of anxiety, depression, insomnia, ADHD and PTSD.  Describes mood today as "ok". Pleasant. Mood symptoms - reports anxiety, depression and irritability. Stating "I feel tense". More depressed lately - "staying in my head too much". Wanting to stay in the bed more on days off. Reports a loss of interest. Stating "I need to go forward somehow". Stating "everything is kinda bland right now". He and wife doing well. Stable interest and motivation. Taking medications as prescribed.  Energy levels vary. Active, does not have a regular exercise routine.  Enjoys some usual interests and activities. Married. Lives with wife and dog - "[name]". Family local.  Appetite stable. Weight stable - 140 pounds. Sleeps well most nights. Averages 6 to 8 hours. Focus and concentration stable. Completing tasks. Managing aspects of household. Working full time - can [employer] - 5 12 hour shifts. No longer serving on active duty in [employer]  Denies SI or HI.  Denies AH or VH.    PHQ2-9    Flowsheet Row Nutrition [facility]01/2020 [facility] Visit from 12/24/2013 in [facility] Visit from 10/09/2013 in [facility]  PHQ-2 Total Score 0 0 0        Review of Systems:  Review of Systems  Musculoskeletal:  Negative for gait problem.  Neurological:  Negative for tremors.  Psychiatric/Behavioral:         Please refer to HPI   Medications: I have reviewed the patient's current medications.  Current Outpatient Medications  Medication Sig Dispense Refill   amphetamine-dextroamphetamine (ADDERALL XR) 20 MG 24 hr capsule Take 1 capsule (20 mg total) by mouth 2 (two) times daily. 60 capsule 0    amphetamine-dextroamphetamine (ADDERALL XR) 20 MG 24 hr capsule Take 1 capsule (20 mg total) by mouth 2 (two) times daily. 60 capsule 0   amphetamine-dextroamphetamine (ADDERALL XR) 20 MG 24 hr capsule Take 1 capsule (20 mg total) by mouth 2 (two) times daily. 60 capsule 0   aspirin 81 MG EC tablet TAKE 1 TABLET BY MOUTH TWICE DAILY     Aspirin-Salicylamide-Caffeine (BC FAST PAIN RELIEF) 650-195-33.3 MG PACK Take 1-2 Packages by mouth every 6 (six) hours as needed (pain).     buPROPion (WELLBUTRIN XL) 150 MG 24 hr tablet Take 3 tablets (450 mg total) by mouth every morning. 270 tablet 1   escitalopram (LEXAPRO) 20 MG tablet Take 1 tablet (20 mg total) by mouth daily. 90 tablet 1   pantoprazole (PROTONIX) 40 MG tablet Take 1 tablet (40 mg total) by mouth daily. 30 tablet 3   traZODone (DESYREL) 100 MG tablet TAKE 1 TABLET BY MOUTH EVERYDAY AT BEDTIME 90 tablet 1   No current facility-administered medications for this visit.    Medication Side Effects: None  Allergies: No Known Allergies  Past Medical History:  Diagnosis Date   GERD (gastroesophageal reflux disease)    HLD (hyperlipidemia)    Vitamin D deficiency     Past Medical History, Surgical history, Social history, and Family history were reviewed and updated as appropriate.   Please see review of systems for further details on the patient's review from today.   Objective:   Physical Exam:  There were no vitals taken  for this visit.  Physical Exam Constitutional:      General: He is not in acute distress. Musculoskeletal:        General: No deformity.  Neurological:     Mental Status: He is alert and oriented to person, place, and time.     Coordination: Coordination normal.  Psychiatric:        Attention and Perception: Attention and perception normal. He does not perceive auditory or visual hallucinations.        Mood and Affect: Mood normal. Mood is not anxious or depressed. Affect is not labile, blunt, angry or  inappropriate.        Speech: Speech normal.        Behavior: Behavior normal.        Thought Content: Thought content normal. Thought content is not paranoid or delusional. Thought content does not include homicidal or suicidal ideation. Thought content does not include homicidal or suicidal plan.        Cognition and Memory: Cognition and memory normal.        Judgment: Judgment normal.     Comments: Insight intact    Lab Review:     Component Value Date/Time   NA 137 02/22/2019 0239   NA 142 02/16/2014 1501   K 3.9 02/22/2019 0239   CL 103 02/22/2019 0239   CO2 25 02/22/2019 0239   GLUCOSE 82 02/22/2019 0239   BUN 23 (H) 02/22/2019 0239   BUN 13 02/16/2014 1501   CREATININE 1.14 02/22/2019 0239   CALCIUM 8.9 02/22/2019 0239   PROT 7.2 02/22/2019 0239   PROT 6.6 02/16/2014 1501   ALBUMIN 4.5 02/22/2019 0239   ALBUMIN 4.9 02/16/2014 1501   AST 30 02/22/2019 0239   ALT 23 02/22/2019 0239   ALKPHOS 80 02/22/2019 0239   BILITOT 0.6 02/22/2019 0239   GFRNONAA >60 02/22/2019 0239   GFRAA >60 02/22/2019 0239       Component Value Date/Time   WBC 7.4 02/22/2019 0239   RBC 4.95 02/22/2019 0239   HGB 14.5 02/22/2019 0239   HCT 43.2 02/22/2019 0239   PLT 343 02/22/2019 0239   MCV 87.3 02/22/2019 0239   MCV 86.9 08/01/2013 1040   MCH 29.3 02/22/2019 0239   MCHC 33.6 02/22/2019 0239   RDW 11.8 02/22/2019 0239    No results found for: POCLITH, LITHIUM   No results found for: PHENYTOIN, PHENOBARB, VALPROATE, CBMZ   .res Assessment: Plan:     Plan:  Add Rexulti 0.5mg  daily Trazadone 50mg  - 1 to 2 at hs Lexapro 20mg  daily Wellbutrin XL 450mg   Aderall XR 20mg  BID - no scripts sent today - out of stock  Monitor BP between visits  RTC 6 months  Patient advised to contact office with any questions, adverse effects, or acute worsening in signs and symptoms.  Discussed potential benefits, risks, and side effects of stimulants with patient to include increased heart  rate, palpitations, insomnia, increased anxiety, increased irritability, or decreased appetite.  Instructed patient to contact office if experiencing any significant tolerability issues.  There are no diagnoses linked to this encounter.   Please see After Visit Summary for patient specific instructions.  No future appointments.  No orders of the defined types were placed in this encounter.   -------------------------------

## 2021-09-21 ENCOUNTER — Telehealth: Payer: Self-pay | Admitting: Adult Health

## 2021-09-21 NOTE — Telephone Encounter (Signed)
Tried calling both # I have for patient, one is not in service and the other is asking for mailbox #. I left a message with wife to have him call our office.

## 2021-09-21 NOTE — Telephone Encounter (Signed)
Pt lvm that he is having problems getting his medicine. Please give him a call at [phone]

## 2021-10-03 ENCOUNTER — Other Ambulatory Visit: Payer: Self-pay

## 2021-10-03 ENCOUNTER — Ambulatory Visit: Payer: Self-pay | Admitting: Adult Health

## 2021-10-03 MED ORDER — ADZENYS XR-ODT 12.5 MG PO TBED: 1.0000 | EXTENDED_RELEASE_TABLET | Freq: Two times a day (BID) | ORAL | 0 refills | Status: DC

## 2021-10-03 NOTE — Telephone Encounter (Signed)
Are they covering that medication for 2 times a day.

## 2021-10-03 NOTE — Progress Notes (Signed)
Patient no show appointment. ? ?

## 2021-10-04 NOTE — Telephone Encounter (Signed)
Script sent  

## 2021-10-04 NOTE — Telephone Encounter (Signed)
The pharmacy said yes.  ?

## 2021-10-04 NOTE — Telephone Encounter (Signed)
I checked with pharmacy and they said it was. ?

## 2021-12-25 IMAGING — CR DG KNEE COMPLETE 4+V*L*
4 series · 4 of 4 positions shown · non-contrast
Comparison: None.

CLINICAL DATA: Swelling of the knee, fall

EXAM:
LEFT KNEE - COMPLETE 4+ VIEW

[w knee ap left]
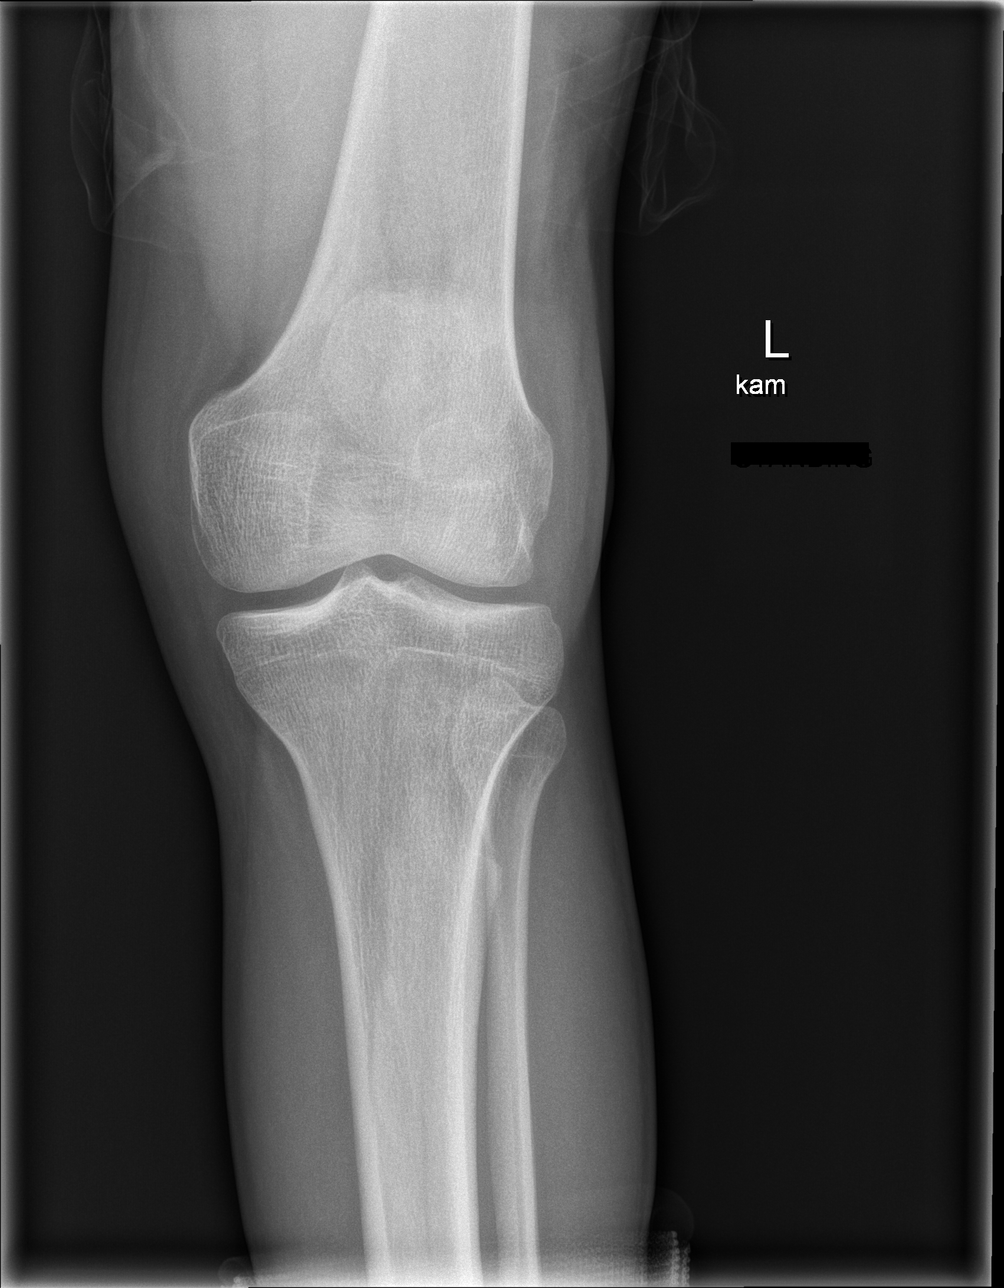

[w knee lat left]
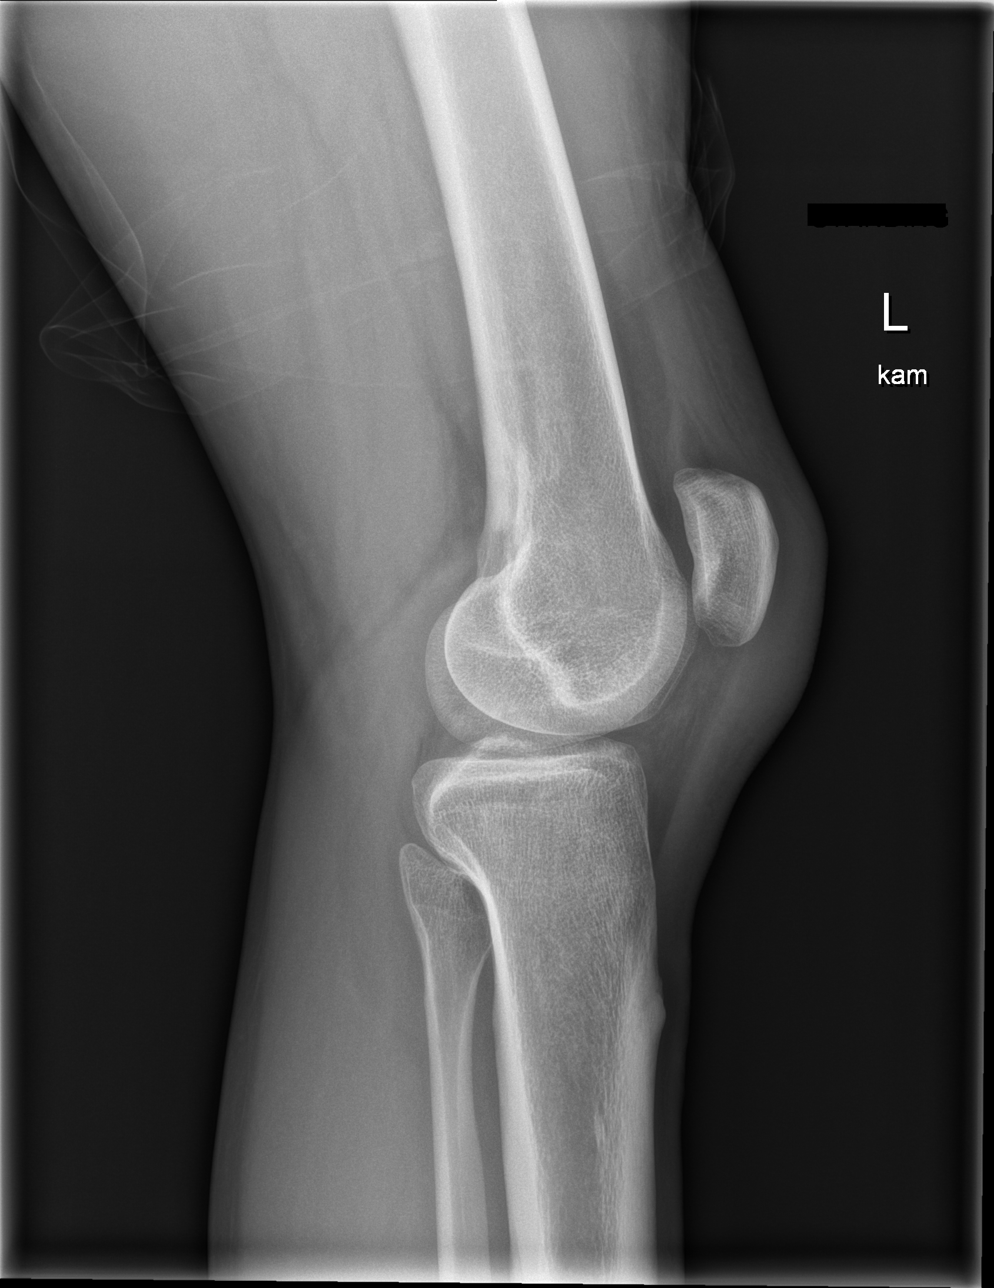

[w knee tunnel pa left]
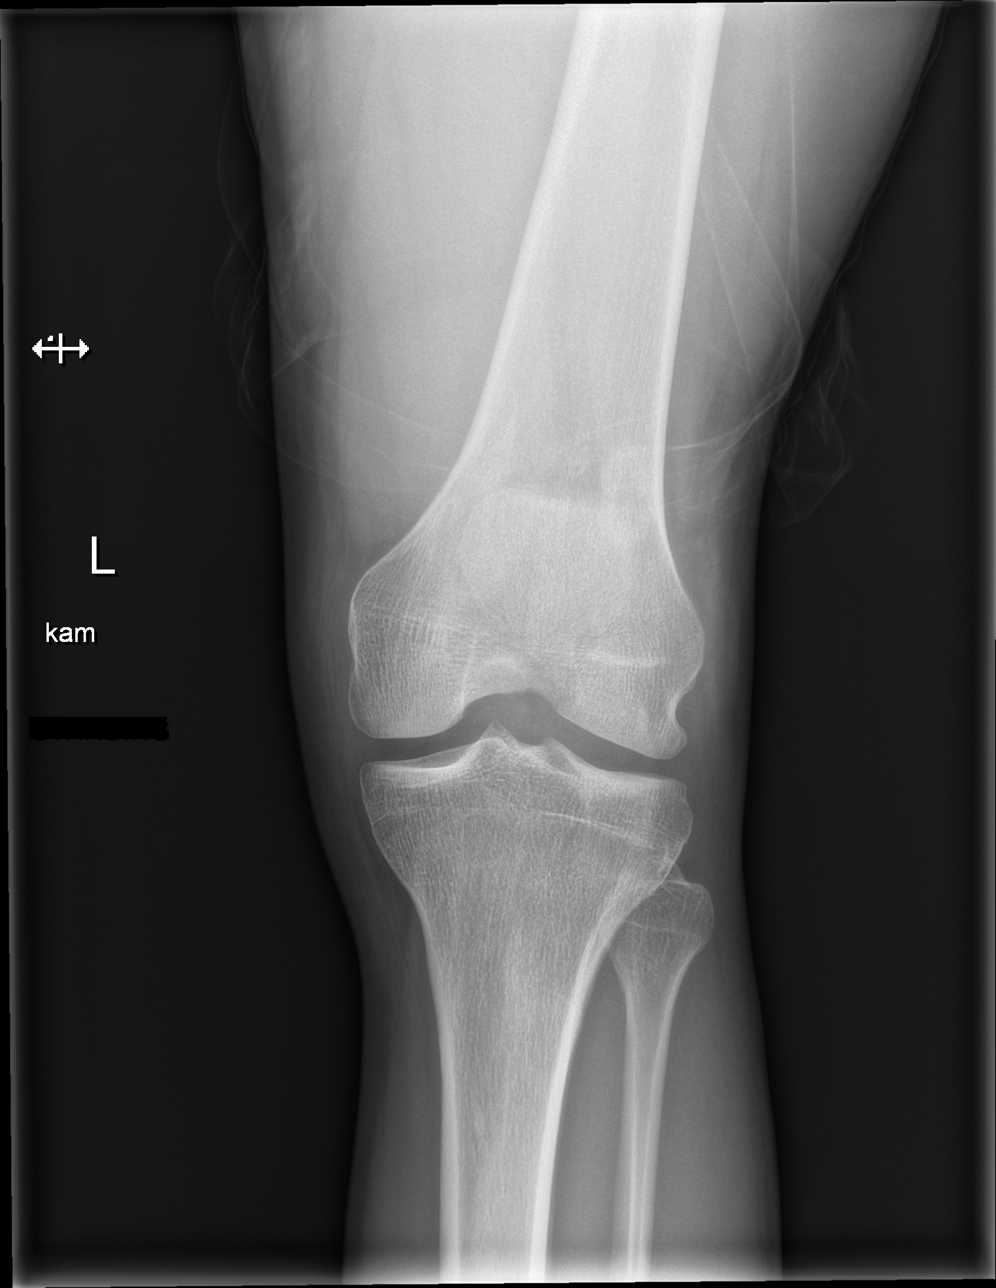

[x knee sunrise left]
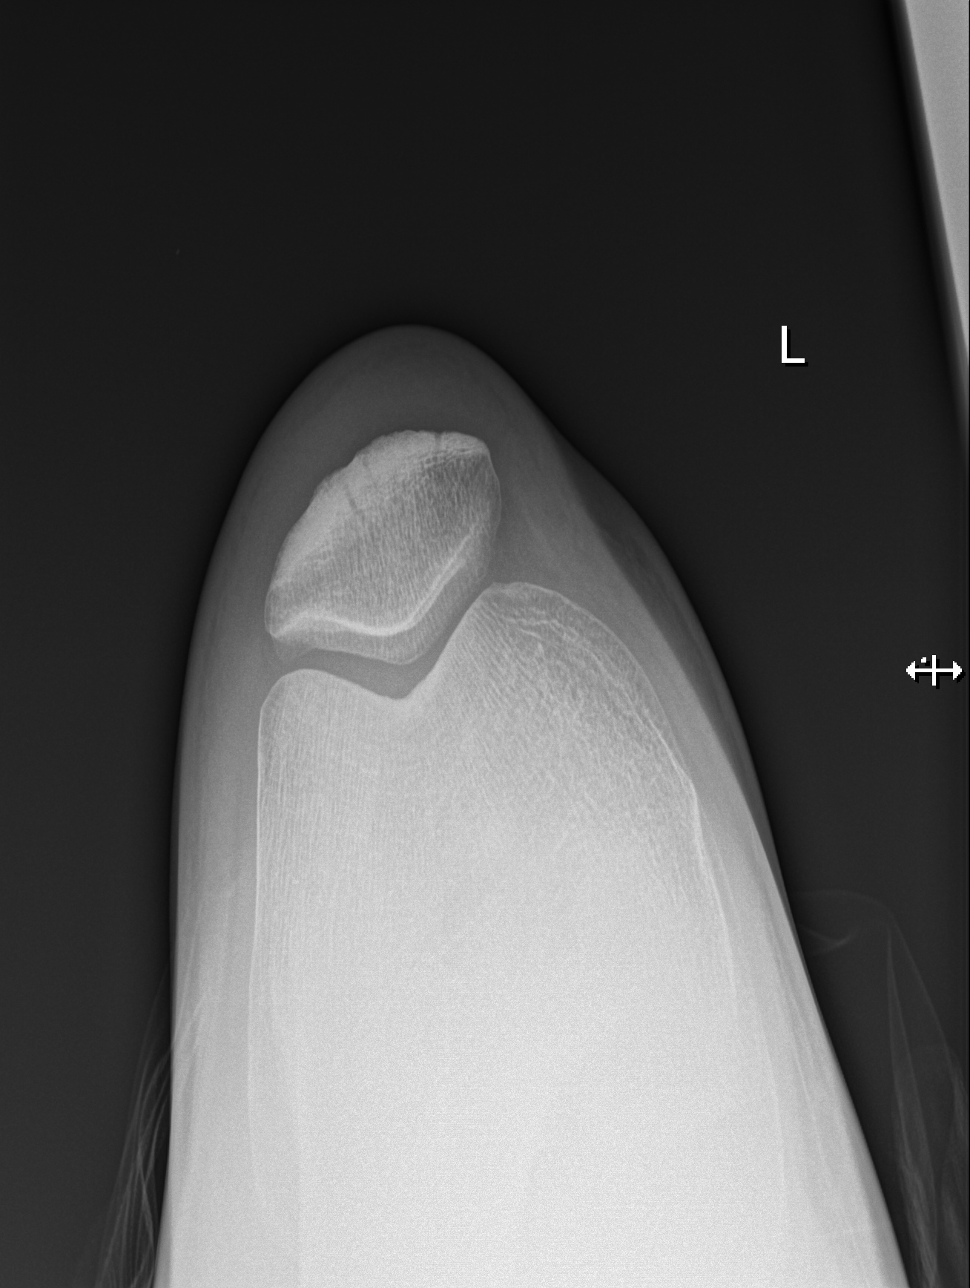

[4 of 4 positions shown; findings below may reference images not displayed]

FINDINGS: No fracture or malalignment. Joint spaces are maintained. Trace knee
effusion.
IMPRESSION: No acute osseous abnormality.  Possible trace knee effusion

## 2022-03-10 ENCOUNTER — Other Ambulatory Visit: Payer: Self-pay | Admitting: Adult Health

## 2022-03-10 DIAGNOSIS — G4700 Insomnia, unspecified: Secondary | ICD-10-CM

## 2022-03-10 NOTE — Telephone Encounter (Signed)
Please schedule appt

## 2022-03-23 NOTE — Telephone Encounter (Signed)
Pt is scheduled 8/30

## 2022-03-29 ENCOUNTER — Ambulatory Visit: Payer: BC Managed Care – PPO | Admitting: Adult Health

## 2022-05-19 ENCOUNTER — Emergency Department (HOSPITAL_COMMUNITY): Payer: No Typology Code available for payment source

## 2022-05-19 ENCOUNTER — Encounter (HOSPITAL_COMMUNITY): Payer: Self-pay

## 2022-05-19 ENCOUNTER — Inpatient Hospital Stay (HOSPITAL_COMMUNITY)
Admission: EM | Admit: 2022-05-19 | Discharge: 2022-05-22 | DRG: 965 | Disposition: A | Discharge status: Home / Self Care | Payer: No Typology Code available for payment source | Attending: General Surgery | Admitting: General Surgery

## 2022-05-19 DIAGNOSIS — Z1331 Encounter for screening for depression: Secondary | ICD-10-CM | POA: Insufficient documentation

## 2022-05-19 DIAGNOSIS — S2241XA Multiple fractures of ribs, right side, initial encounter for closed fracture: Secondary | ICD-10-CM | POA: Diagnosis present

## 2022-05-19 DIAGNOSIS — G8929 Other chronic pain: Secondary | ICD-10-CM | POA: Diagnosis present

## 2022-05-19 DIAGNOSIS — Z79899 Other long term (current) drug therapy: Secondary | ICD-10-CM | POA: Diagnosis not present

## 2022-05-19 DIAGNOSIS — H527 Unspecified disorder of refraction: Secondary | ICD-10-CM | POA: Insufficient documentation

## 2022-05-19 DIAGNOSIS — N201 Calculus of ureter: Secondary | ICD-10-CM | POA: Insufficient documentation

## 2022-05-19 DIAGNOSIS — Z7982 Long term (current) use of aspirin: Secondary | ICD-10-CM

## 2022-05-19 DIAGNOSIS — N200 Calculus of kidney: Secondary | ICD-10-CM

## 2022-05-19 DIAGNOSIS — R12 Heartburn: Secondary | ICD-10-CM | POA: Insufficient documentation

## 2022-05-19 DIAGNOSIS — K219 Gastro-esophageal reflux disease without esophagitis: Secondary | ICD-10-CM | POA: Diagnosis present

## 2022-05-19 DIAGNOSIS — S36113A Laceration of liver, unspecified degree, initial encounter: Secondary | ICD-10-CM | POA: Diagnosis present

## 2022-05-19 DIAGNOSIS — E860 Dehydration: Secondary | ICD-10-CM | POA: Insufficient documentation

## 2022-05-19 DIAGNOSIS — G4733 Obstructive sleep apnea (adult) (pediatric): Secondary | ICD-10-CM | POA: Diagnosis present

## 2022-05-19 DIAGNOSIS — R21 Rash and other nonspecific skin eruption: Secondary | ICD-10-CM | POA: Insufficient documentation

## 2022-05-19 DIAGNOSIS — R112 Nausea with vomiting, unspecified: Secondary | ICD-10-CM | POA: Insufficient documentation

## 2022-05-19 DIAGNOSIS — S0003XA Contusion of scalp, initial encounter: Secondary | ICD-10-CM | POA: Diagnosis present

## 2022-05-19 DIAGNOSIS — Z0110 Encounter for examination of ears and hearing without abnormal findings: Secondary | ICD-10-CM | POA: Insufficient documentation

## 2022-05-19 DIAGNOSIS — M25572 Pain in left ankle and joints of left foot: Secondary | ICD-10-CM | POA: Diagnosis present

## 2022-05-19 DIAGNOSIS — S80212A Abrasion, left knee, initial encounter: Secondary | ICD-10-CM | POA: Diagnosis present

## 2022-05-19 DIAGNOSIS — S270XXA Traumatic pneumothorax, initial encounter: Secondary | ICD-10-CM | POA: Diagnosis present

## 2022-05-19 DIAGNOSIS — S6710XA Crushing injury of unspecified finger(s), initial encounter: Secondary | ICD-10-CM | POA: Insufficient documentation

## 2022-05-19 DIAGNOSIS — E785 Hyperlipidemia, unspecified: Secondary | ICD-10-CM | POA: Diagnosis present

## 2022-05-19 HISTORY — DX: Gastro-esophageal reflux disease without esophagitis: K21.9

## 2022-05-19 HISTORY — DX: Calculus of kidney: N20.0

## 2022-05-19 LAB — COMPREHENSIVE METABOLIC PANEL
ALT: 193 U/L — ABNORMAL HIGH (ref 0–44)
AST: 213 U/L — ABNORMAL HIGH (ref 15–41)
Albumin: 4.6 g/dL (ref 3.5–5.0)
Alkaline Phosphatase: 70 U/L (ref 38–126)
Anion gap: 9 (ref 5–15)
BUN: 12 mg/dL (ref 6–20)
CO2: 29 mmol/L (ref 22–32)
Calcium: 9.4 mg/dL (ref 8.9–10.3)
Chloride: 103 mmol/L (ref 98–111)
Creatinine, Ser: 1.13 mg/dL (ref 0.61–1.24)
GFR, Estimated: >60 mL/min (ref >60)
Glucose, Bld: 123 mg/dL — ABNORMAL HIGH (ref 70–99)
Potassium: 4.1 mmol/L (ref 3.5–5.1)
Sodium: 141 mmol/L (ref 135–145)
Total Bilirubin: 1.2 mg/dL (ref 0.3–1.2)
Total Protein: 7.5 g/dL (ref 6.5–8.1)

## 2022-05-19 LAB — CBC
HCT: 43.3 % (ref 39.0–52.0)
Hemoglobin: 15.3 g/dL (ref 13.0–17.0)
MCH: 30.5 pg (ref 26.0–34.0)
MCHC: 35.3 g/dL (ref 30.0–36.0)
MCV: 86.3 fL (ref 80.0–100.0)
Platelets: 280 10*3/uL (ref 150–400)
RBC: 5.02 MIL/uL (ref 4.22–5.81)
RDW: 11.6 % (ref 11.5–15.5)
WBC: 11.4 10*3/uL — ABNORMAL HIGH (ref 4.0–10.5)
nRBC: 0 % (ref 0.0–0.2)

## 2022-05-19 LAB — I-STAT CHEM 8, ED
BUN: 11 mg/dL (ref 6–20)
Calcium, Ion: 1.16 mmol/L (ref 1.15–1.40)
Chloride: 101 mmol/L (ref 98–111)
Creatinine, Ser: 1.2 mg/dL (ref 0.61–1.24)
Glucose, Bld: 122 mg/dL — ABNORMAL HIGH (ref 70–99)
HCT: 44 % (ref 39.0–52.0)
Hemoglobin: 15 g/dL (ref 13.0–17.0)
Potassium: 4.2 mmol/L (ref 3.5–5.1)
Sodium: 141 mmol/L (ref 135–145)
TCO2: 28 mmol/L (ref 22–32)

## 2022-05-19 LAB — LACTIC ACID, PLASMA: Lactic Acid, Venous: 1.8 mmol/L (ref 0.5–1.9)

## 2022-05-19 LAB — URINALYSIS, ROUTINE W REFLEX MICROSCOPIC
Bilirubin Urine: NEGATIVE
Glucose, UA: NEGATIVE mg/dL
Hgb urine dipstick: NEGATIVE
Ketones, ur: NEGATIVE mg/dL
Leukocytes,Ua: NEGATIVE
Nitrite: NEGATIVE
Protein, ur: NEGATIVE mg/dL
Specific Gravity, Urine: >1.046 — ABNORMAL HIGH (ref 1.005–1.030)
pH: 5 (ref 5.0–8.0)

## 2022-05-19 LAB — PROTIME-INR
INR: 1 (ref 0.8–1.2)
Prothrombin Time: 13.5 seconds (ref 11.4–15.2)

## 2022-05-19 LAB — SAMPLE TO BLOOD BANK

## 2022-05-19 LAB — ETHANOL: Alcohol, Ethyl (B): <10 mg/dL (ref <10)

## 2022-05-19 MED ORDER — MORPHINE SULFATE (PF) 4 MG/ML IV SOLN
4.0000 mg | INTRAVENOUS | Status: DC | PRN
  Administered 2022-05-19 – 2022-05-20 (×5): 4 mg via INTRAVENOUS
  Filled 2022-05-19 (×6): qty 1

## 2022-05-19 MED ORDER — LORAZEPAM 1 MG PO TABS: 1.0000 mg | ORAL_TABLET | ORAL | Status: DC | PRN

## 2022-05-19 MED ORDER — ONDANSETRON HCL 4 MG/2ML IJ SOLN
4.0000 mg | Freq: Once | INTRAMUSCULAR | Status: AC
  Administered 2022-05-19: 4 mg via INTRAVENOUS
  Filled 2022-05-19: qty 2

## 2022-05-19 MED ORDER — THIAMINE MONONITRATE 100 MG PO TABS
100.0000 mg | ORAL_TABLET | Freq: Every day | ORAL | Status: DC
  Administered 2022-05-19 – 2022-05-22 (×4): 100 mg via ORAL
  Filled 2022-05-19 (×4): qty 1

## 2022-05-19 MED ORDER — MORPHINE SULFATE (PF) 4 MG/ML IV SOLN
4.0000 mg | Freq: Once | INTRAVENOUS | Status: AC
  Administered 2022-05-19: 4 mg via INTRAVENOUS
  Filled 2022-05-19: qty 1

## 2022-05-19 MED ORDER — FOLIC ACID 1 MG PO TABS
1.0000 mg | ORAL_TABLET | Freq: Every day | ORAL | Status: DC
  Administered 2022-05-19 – 2022-05-22 (×4): 1 mg via ORAL
  Filled 2022-05-19 (×4): qty 1

## 2022-05-19 MED ORDER — PANTOPRAZOLE SODIUM 40 MG PO TBEC
40.0000 mg | DELAYED_RELEASE_TABLET | Freq: Every day | ORAL | Status: DC
  Administered 2022-05-19 – 2022-05-22 (×4): 40 mg via ORAL
  Filled 2022-05-19 (×4): qty 1

## 2022-05-19 MED ORDER — MELATONIN 3 MG PO TABS: 3.0000 mg | ORAL_TABLET | Freq: Every evening | ORAL | Status: DC | PRN

## 2022-05-19 MED ORDER — DOCUSATE SODIUM 100 MG PO CAPS
100.0000 mg | ORAL_CAPSULE | Freq: Two times a day (BID) | ORAL | Status: DC
  Administered 2022-05-19 – 2022-05-22 (×7): 100 mg via ORAL
  Filled 2022-05-19 (×7): qty 1

## 2022-05-19 MED ORDER — ONDANSETRON 4 MG PO TBDP: 4.0000 mg | ORAL_TABLET | Freq: Four times a day (QID) | ORAL | Status: DC | PRN

## 2022-05-19 MED ORDER — BUPROPION HCL ER (XL) 150 MG PO TB24
450.0000 mg | ORAL_TABLET | ORAL | Status: DC
  Administered 2022-05-20 – 2022-05-22 (×3): 450 mg via ORAL
  Filled 2022-05-19: qty 3
  Filled 2022-05-19: qty 1
  Filled 2022-05-19 (×2): qty 3

## 2022-05-19 MED ORDER — ONDANSETRON HCL 4 MG/2ML IJ SOLN
4.0000 mg | Freq: Four times a day (QID) | INTRAMUSCULAR | Status: DC | PRN
  Administered 2022-05-20: 4 mg via INTRAVENOUS
  Filled 2022-05-19: qty 2

## 2022-05-19 MED ORDER — TRAZODONE HCL 50 MG PO TABS
150.0000 mg | ORAL_TABLET | Freq: Every evening | ORAL | Status: DC | PRN
  Administered 2022-05-20: 150 mg via ORAL
  Filled 2022-05-19: qty 1

## 2022-05-19 MED ORDER — THIAMINE HCL 100 MG/ML IJ SOLN: 100.0000 mg | Freq: Every day | INTRAMUSCULAR | Status: DC

## 2022-05-19 MED ORDER — ADULT MULTIVITAMIN W/MINERALS CH
1.0000 | ORAL_TABLET | Freq: Every day | ORAL | Status: DC
  Administered 2022-05-19 – 2022-05-22 (×4): 1 via ORAL
  Filled 2022-05-19 (×4): qty 1

## 2022-05-19 MED ORDER — LORAZEPAM 2 MG/ML IJ SOLN: 1.0000 mg | INTRAMUSCULAR | Status: DC | PRN

## 2022-05-19 MED ORDER — FENTANYL CITRATE PF 50 MCG/ML IJ SOSY
50.0000 ug | PREFILLED_SYRINGE | Freq: Once | INTRAMUSCULAR | Status: AC
  Administered 2022-05-19: 50 ug via INTRAVENOUS
  Filled 2022-05-19: qty 1

## 2022-05-19 MED ORDER — IOHEXOL 300 MG/ML  SOLN
100.0000 mL | Freq: Once | INTRAMUSCULAR | Status: AC | PRN
  Administered 2022-05-19: 100 mL via INTRAVENOUS

## 2022-05-19 MED ORDER — HYDROMORPHONE HCL 1 MG/ML IJ SOLN
0.5000 mg | INTRAMUSCULAR | Status: DC | PRN
  Administered 2022-05-20: 0.5 mg via INTRAVENOUS
  Filled 2022-05-19: qty 0.5

## 2022-05-19 MED ORDER — HYDRALAZINE HCL 20 MG/ML IJ SOLN: 10.0000 mg | INTRAMUSCULAR | Status: DC | PRN

## 2022-05-19 MED ORDER — OXYCODONE HCL 5 MG PO TABS
5.0000 mg | ORAL_TABLET | Freq: Four times a day (QID) | ORAL | Status: DC | PRN
  Administered 2022-05-19 – 2022-05-20 (×3): 5 mg via ORAL
  Filled 2022-05-19: qty 2
  Filled 2022-05-19 (×2): qty 1
  Filled 2022-05-19: qty 2
  Filled 2022-05-19: qty 1

## 2022-05-19 MED ORDER — ESCITALOPRAM OXALATE 10 MG PO TABS
20.0000 mg | ORAL_TABLET | Freq: Every day | ORAL | Status: DC
  Administered 2022-05-19 – 2022-05-22 (×4): 20 mg via ORAL
  Filled 2022-05-19 (×4): qty 2

## 2022-05-19 MED ORDER — ACETAMINOPHEN 500 MG PO TABS
1000.0000 mg | ORAL_TABLET | Freq: Four times a day (QID) | ORAL | Status: DC
  Administered 2022-05-19 – 2022-05-22 (×9): 1000 mg via ORAL
  Filled 2022-05-19 (×9): qty 2
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
## 2022-05-19 NOTE — ED Notes (Signed)
Patient transported to CT 
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
## 2022-05-19 NOTE — ED Notes (Signed)
C-collar applied prior to port xrays
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
## 2022-05-19 NOTE — ED Notes (Signed)
Lab tech at bedside drawing blood at this time.
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
## 2022-05-19 NOTE — Progress Notes (Signed)
Transition of Care [facility]) - CAGE-AID Screening   Patient Details  Name: Antonio [name] MRN: [id number] Date of Birth: [birth year]   Elvina Sidle, RN Trauma Response Nurse Phone Number: [phone] 05/19/2022, 4:35 PM   CAGE-AID Screening:    Have You Ever Felt You Ought to Cut Down on Your Drinking or Drug Use?: Yes Have People Annoyed You By Critizing Your Drinking Or Drug Use?: Yes Have You Felt Bad Or Guilty About Your Drinking Or Drug Use?: Yes Have You Ever Had a Drink or Used Drugs First Thing In The Morning to Steady Your Nerves or to Get Rid of a Hangover?: No CAGE-AID Score: 3  Substance Abuse Education Offered: No (pt states that he has "cut back" on his drinking. Family at bedside states that he has tried to quit, but occasionally gets cravings.)

## 2022-05-19 NOTE — ED Notes (Signed)
ED Provider at bedside. 
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
## 2022-05-20 ENCOUNTER — Inpatient Hospital Stay (HOSPITAL_COMMUNITY): Payer: No Typology Code available for payment source

## 2022-05-20 LAB — CBC
HCT: 39.1 % (ref 39.0–52.0)
Hemoglobin: 13.8 g/dL (ref 13.0–17.0)
MCH: 30.3 pg (ref 26.0–34.0)
MCHC: 35.3 g/dL (ref 30.0–36.0)
MCV: 85.9 fL (ref 80.0–100.0)
Platelets: 242 10*3/uL (ref 150–400)
RBC: 4.55 MIL/uL (ref 4.22–5.81)
RDW: 11.4 % — ABNORMAL LOW (ref 11.5–15.5)
WBC: 6.4 10*3/uL (ref 4.0–10.5)
nRBC: 0 % (ref 0.0–0.2)

## 2022-05-20 LAB — HIV ANTIBODY (ROUTINE TESTING W REFLEX): HIV Screen 4th Generation wRfx: NONREACTIVE

## 2022-05-20 LAB — BASIC METABOLIC PANEL
Anion gap: 9 (ref 5–15)
BUN: 14 mg/dL (ref 6–20)
CO2: 27 mmol/L (ref 22–32)
Calcium: 9 mg/dL (ref 8.9–10.3)
Chloride: 100 mmol/L (ref 98–111)
Creatinine, Ser: 1.18 mg/dL (ref 0.61–1.24)
GFR, Estimated: >60 mL/min (ref >60)
Glucose, Bld: 87 mg/dL (ref 70–99)
Potassium: 3.6 mmol/L (ref 3.5–5.1)
Sodium: 136 mmol/L (ref 135–145)

## 2022-05-20 MED ORDER — METHOCARBAMOL 1000 MG/10ML IJ SOLN
1000.0000 mg | Freq: Three times a day (TID) | INTRAVENOUS | Status: DC
  Administered 2022-05-20 – 2022-05-22 (×6): 1000 mg via INTRAVENOUS
  Filled 2022-05-20 (×8): qty 10
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
## 2022-05-21 LAB — CBC
HCT: 38.3 % — ABNORMAL LOW (ref 39.0–52.0)
Hemoglobin: 13.3 g/dL (ref 13.0–17.0)
MCH: 30 pg (ref 26.0–34.0)
MCHC: 34.7 g/dL (ref 30.0–36.0)
MCV: 86.5 fL (ref 80.0–100.0)
Platelets: 237 10*3/uL (ref 150–400)
RBC: 4.43 MIL/uL (ref 4.22–5.81)
RDW: 11.4 % — ABNORMAL LOW (ref 11.5–15.5)
WBC: 5 10*3/uL (ref 4.0–10.5)
nRBC: 0 % (ref 0.0–0.2)

## 2022-05-21 MED ORDER — KETOROLAC TROMETHAMINE 30 MG/ML IJ SOLN
30.0000 mg | Freq: Three times a day (TID) | INTRAMUSCULAR | Status: DC | PRN
  Administered 2022-05-21: 30 mg via INTRAVENOUS
  Filled 2022-05-21: qty 1
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
## 2022-05-22 LAB — CBC
HCT: 37.5 % — ABNORMAL LOW (ref 39.0–52.0)
Hemoglobin: 13 g/dL (ref 13.0–17.0)
MCH: 29.9 pg (ref 26.0–34.0)
MCHC: 34.7 g/dL (ref 30.0–36.0)
MCV: 86.2 fL (ref 80.0–100.0)
Platelets: 250 10*3/uL (ref 150–400)
RBC: 4.35 MIL/uL (ref 4.22–5.81)
RDW: 11.2 % — ABNORMAL LOW (ref 11.5–15.5)
WBC: 4.8 10*3/uL (ref 4.0–10.5)
nRBC: 0 % (ref 0.0–0.2)

## 2022-05-22 MED ORDER — FOLIC ACID 1 MG PO TABS: 1.0000 mg | ORAL_TABLET | Freq: Every day | ORAL | Status: AC

## 2022-05-22 MED ORDER — ADULT MULTIVITAMIN W/MINERALS CH: 1.0000 | ORAL_TABLET | Freq: Every day | ORAL | Status: AC

## 2022-05-22 MED ORDER — ACETAMINOPHEN 500 MG PO TABS: 1000.0000 mg | ORAL_TABLET | Freq: Three times a day (TID) | ORAL | 0 refills | Status: AC | PRN

## 2022-05-22 MED ORDER — VITAMIN B-1 100 MG PO TABS: 100.0000 mg | ORAL_TABLET | Freq: Every day | ORAL | Status: AC

## 2022-05-22 MED ORDER — OXYCODONE HCL 5 MG PO TABS: 5.0000 mg | ORAL_TABLET | Freq: Four times a day (QID) | ORAL | 0 refills | Status: DC | PRN
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
## 2022-05-22 NOTE — Discharge Instructions (Signed)
RIB FRACTURES  HOME INSTRUCTIONS   PAIN CONTROL:  Pain is best controlled by a usual combination of three different methods TOGETHER:  Ice/Heat Over the counter pain medication Prescription pain medication You may experience some swelling and bruising in area of broken ribs. Ice packs or heating pads (30-60 minutes up to 6 times a day) will help. Use ice for the first few days to help decrease swelling and bruising, then switch to heat to help relax tight/sore spots and speed recovery. Some people prefer to use ice alone, heat alone, alternating between ice & heat. Experiment to what works for you. Swelling and bruising can take several weeks to resolve.  It is helpful to take an over-the-counter pain medication regularly for the first few weeks. Choose one of the following that works best for you:  Naproxen (Aleve, etc) Two 280m tabs twice a day Ibuprofen (Advil, etc) Three 2043mtabs four times a day (every meal & bedtime) Acetaminophen (Tylenol, etc) 500-6503mour times a day (every meal & bedtime) A prescription for pain medication (such as oxycodone, hydrocodone, etc) may be given to you upon discharge. Take your pain medication as prescribed.  If you are having problems/concerns with the prescription medicine (does not control pain, nausea, vomiting, rash, itching, etc), please call us [phone] see if we need to switch you to a different pain medicine that will work better for you and/or control your side effect better. If you need a refill on your pain medication, please contact your pharmacy. They will contact our office to request authorization. Prescriptions will not be filled after 5 pm or on week-ends. Avoid getting constipated. When taking pain medications, it is common to experience some constipation. Increasing fluid intake and taking a fiber supplement (such as Metamucil, Citrucel, FiberCon, MiraLax, etc) 1-2 times a day regularly will usually help prevent this problem  from occurring. A mild laxative (prune juice, Milk of Magnesia, MiraLax, etc) should be taken according to package directions if there are no bowel movements after 48 hours.  Watch out for diarrhea. If you have many loose bowel movements, simplify your diet to bland foods & liquids for a few days. Stop any stool softeners and decrease your fiber supplement. Switching to mild anti-diarrheal medications (Kayopectate, Pepto Bismol) can help. If this worsens or does not improve, please call us.[place]OLLOW UP  If a follow up appointment is needed one will be scheduled for you. If none is needed with our trauma team, please follow up with your primary care provider within 2-3 weeks from discharge. Please call [facility] at [phone] if you have any questions about follow up.  If you have any orthopedic or other injuries you will need to follow up as outlined in your follow up instructions.   WHEN TO CALL US [phone] pain control Reactions / problems with new medications (rash/itching, nausea, etc)  Fever over 101.5 F (38.5 C) Worsening swelling or bruising Worsening pain, productive cough, difficulty breathing or any other concerning symptoms  The clinic staff is available to answer your questions during regular business hours (8:30am-5pm). Please don't hesitate to call and ask to speak to one of our nurses for clinical concerns.  If you have a medical emergency, go to the nearest emergency room or call 911.  A surgeon from [facility] is always on call at the [facility] [place] [id number] MAIN: [phone]  [phone] ? TOLL FREE: [phone] ?  FAX [phone]  [email or web address]      Information on Rib Fractures  A rib fracture is a break or crack in one of the bones of the ribs. The ribs are long, curved bones that wrap around your chest and attach to your spine and your breastbone. The  ribs protect your heart, lungs, and other organs in the chest. A broken or cracked rib is often painful but is not usually serious. Most rib fractures heal on their own over time. However, rib fractures can be more serious if multiple ribs are broken or if broken ribs move out of place and push against other structures or organs. What are the causes? This condition is caused by: Repetitive movements with high force, such as pitching a baseball or having severe coughing spells. A direct blow to the chest, such as a sports injury, a car accident, or a fall. Cancer that has spread to the bones, which can weaken bones and cause them to break. What are the signs or symptoms? Symptoms of this condition include: Pain when you breathe in or cough. Pain when someone presses on the injured area. Feeling short of breath. How is this diagnosed? This condition is diagnosed with a physical exam and medical history. Imaging tests may also be done, such as: Chest X-ray. CT scan. MRI. Lohn scan. Chest ultrasound. How is this treated? Treatment for this condition depends on the severity of the fracture. Most rib fractures usually heal on their own in 1-3 months. Sometimes healing takes longer if there is a cough that does not stop or if there are other activities that make the injury worse (aggravating factors). While you heal, you will be given medicines to control the pain. You will also be taught deep breathing exercises. Severe injuries may require hospitalization or surgery. Follow these instructions at home: Managing pain, stiffness, and swelling If directed, apply ice to the injured area. Put ice in a plastic bag. Place a towel between your skin and the bag. Leave the ice on for 20 minutes, 2-3 times a day. Take over-the-counter and prescription medicines only as told by your health care provider. Activity Avoid a lot of activity and any activities or movements that cause pain. Be careful during  activities and avoid bumping the injured rib. Slowly increase your activity as told by your health care provider. General instructions Do deep breathing exercises as told by your health care provider. This helps prevent pneumonia, which is a common complication of a broken rib. Your health care provider may instruct you to: Take deep breaths several times a day. Try to cough several times a day, holding a pillow against the injured area. Use a device called incentive spirometer to practice deep breathing several times a day. Drink enough fluid to keep your urine pale yellow. Do not wear a rib belt or binder. These restrict breathing, which can lead to pneumonia. Keep all follow-up visits as told by your health care provider. This is important. Contact a health care provider if: You have a fever. Get help right away if: You have difficulty breathing or you are short of breath. You develop a cough that does not stop, or you cough up thick or bloody sputum. You have nausea, vomiting, or pain in your abdomen. Your pain gets worse and medicine does not help. Summary A rib fracture is a break or crack in one of the bones of the ribs. A broken or cracked rib is  often painful but is not usually serious. Most rib fractures heal on their own over time. Treatment for this condition depends on the severity of the fracture. Avoid a lot of activity and any activities or movements that cause pain. This information is not intended to replace advice given to you by your health care provider. Make sure you discuss any questions you have with your health care provider. Document Released: 07/17/2005 Document Revised: 10/16/2016 Document Reviewed: 10/16/2016 Elsevier Interactive Patient Education  2019 Elsevier Inc.  
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
## 2022-06-25 ENCOUNTER — Other Ambulatory Visit: Payer: Self-pay | Admitting: Adult Health

## 2022-06-25 DIAGNOSIS — G4700 Insomnia, unspecified: Secondary | ICD-10-CM

## 2022-08-01 ENCOUNTER — Encounter: Payer: Self-pay | Admitting: Adult Health

## 2022-08-01 ENCOUNTER — Ambulatory Visit (INDEPENDENT_AMBULATORY_CARE_PROVIDER_SITE_OTHER): Payer: 59 | Admitting: Adult Health

## 2022-08-01 DIAGNOSIS — F909 Attention-deficit hyperactivity disorder, unspecified type: Secondary | ICD-10-CM

## 2022-08-01 DIAGNOSIS — G4700 Insomnia, unspecified: Secondary | ICD-10-CM | POA: Diagnosis not present

## 2022-08-01 DIAGNOSIS — F331 Major depressive disorder, recurrent, moderate: Secondary | ICD-10-CM | POA: Diagnosis not present

## 2022-08-01 DIAGNOSIS — F411 Generalized anxiety disorder: Secondary | ICD-10-CM | POA: Diagnosis not present

## 2022-08-01 MED ORDER — BUPROPION HCL ER (XL) 150 MG PO TB24: 450.0000 mg | ORAL_TABLET | ORAL | 0 refills | Status: DC

## 2022-08-01 MED ORDER — ESCITALOPRAM OXALATE 20 MG PO TABS: ORAL_TABLET | ORAL | 0 refills | Status: DC

## 2022-08-01 MED ORDER — AMPHETAMINE-DEXTROAMPHET ER 20 MG PO CP24: 20.0000 mg | ORAL_CAPSULE | Freq: Two times a day (BID) | ORAL | 0 refills | Status: DC

## 2022-08-01 MED ORDER — TRAZODONE HCL 50 MG PO TABS: ORAL_TABLET | ORAL | 0 refills | Status: DC

## 2022-08-01 NOTE — Progress Notes (Signed)
TOMA ARTS [id number] 1986-01-20 37 y.o.  Subjective:   Patient ID:  Antonio [name] is a 37 y.o. (DOB 07-Mar-1986) adult.  Chief Complaint: No chief complaint on file.   HPI Antonio [name] presents to the office today for follow-up of anxiety, depression, insomnia, ADHD and PTSD.  Describes mood today as "ok". Pleasant. Denies tearfukness. Mood symptoms - reports depression. Denies anxiety and irritability. Stating "I'm feeling more depressed". Ran out of some of his medications since last seen and would like to restart them. Does not wish to restart the Rexulti at this time. Has started a new job at [employer]. He and wife doing well. Stable interest and motivation. Taking medications as prescribed.  Energy levels vary. Active, does not have a regular exercise routine.  Enjoys some usual interests and activities. Married. Lives with wife and dog - "[name]". Family local.  Appetite stable. Weight stable - 140 pounds. Sleeps well most nights. Averages 7 to 8 hours when working and more when off. Focus and concentration stable - using as needed. Completing tasks. Managing aspects of household. 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  Denies SI or HI.  Denies AH or VH.     CAGE-AID    Flowsheet Row ED to Hosp-Admission (Discharged) from 05/19/2022 in [place] Score 3      PHQ2-9    [place] from 09/01/2020 in [facility] Office Visit from 12/24/2013 in [place] Visit from 10/09/2013 in [place]  PHQ-2 Total Score 0 0 0      Flowsheet Row ED to Hosp-Admission (Discharged) from 05/19/2022 in [place] CATEGORY No Risk        Review of Systems:  Review of Systems  Musculoskeletal:  Negative for gait problem.  Neurological:  Negative for  tremors.  Psychiatric/Behavioral:         Please refer to HPI    Medications: I have reviewed the patient's current medications.  Current Outpatient Medications  Medication Sig Dispense Refill   amphetamine-dextroamphetamine (ADDERALL XR) 20 MG 24 hr capsule Take 1 capsule (20 mg total) by mouth 2 (two) times daily. 60 capsule 0   acetaminophen (TYLENOL) 500 MG tablet Take 2 tablets (1,000 mg total) by mouth every 8 (eight) hours as needed. 30 tablet 0   buPROPion (WELLBUTRIN XL) 150 MG 24 hr tablet Take 3 tablets (450 mg total) by mouth every morning. 270 tablet 0   COVID-19 mRNA vaccine, Moderna, 100 MCG/0.5ML injection      escitalopram (LEXAPRO) 20 MG tablet Take 1/2 tablet daily for 7 days, then increase to one tablet daily. 90 tablet 0   folic acid (FOLVITE) 1 MG tablet Take 1 tablet (1 mg total) by mouth daily.     Multiple Vitamin (MULTIVITAMIN WITH MINERALS) TABS tablet Take 1 tablet by mouth daily.     oxyCODONE (OXY IR/ROXICODONE) 5 MG immediate release tablet Take 1 tablet (5 mg total) by mouth every 6 (six) hours as needed for breakthrough pain. 15 tablet 0   pantoprazole (PROTONIX) 40 MG tablet Take 1 tablet (40 mg total) by mouth daily. (Patient not taking: Reported on 05/19/2022) 30 tablet 3   thiamine (VITAMIN B-1) 100 MG tablet Take 1 tablet (100 mg total) by mouth daily.     traZODone (DESYREL) 50 MG tablet Take one  to two tablets at bedtime as needed. 180 tablet 0   No current facility-administered medications for this visit.    Medication Side Effects: None  Allergies: No Known Allergies  Past Medical History:  Diagnosis Date   Anxiety 11/02/2015   Chronic pain of left ankle 04/24/2018   Depression 11/02/2015   Gastroesophageal reflux disease    question underlying stricture as pt is vomiting up undigested food particles. will try to obtain the ugi asap as he cannot be scheduled until feb 22.  spoke with dr Jens Som who cannot add onto the schedule.  will attempt to  schedule thru [facility] asap  Dec 04, 2008 Entered By: Cornelia Copa R Comment: nl EGD 1/09   GERD (gastroesophageal reflux disease)    Hemangioma 01/24/2016   HLD (hyperlipidemia)    Nephrolithiasis 05/19/2022   OSA on CPAP 01/06/2016   Home sleep study: 12/22/2015 Mild Sleep Apnea  Total apnea events over 3.6 hours = 22 Hypoxemia events for an index of 7.9 hours=48 10 obstructive apneas 11 central apneas 1 mixed apnea AHI= 11.6 per hour of sleep Cheyne-Stokes respirations were not observed Hypoventilation was not observed 41 desaturations occurred during the study Lowest saturation was 91% with an average of 95% Minimum saturati   Pityriasis versicolor 02/04/2019   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   Suicidal ideation 02/04/2019   Vitamin D deficiency     Past Medical History, Surgical history, Social history, and Family history were reviewed and updated as appropriate.   Please see review of systems for further details on the patient's review from today.   Objective:   Physical Exam:  There were no vitals taken for this visit.  Physical Exam Constitutional:      General: She is not in acute distress. Musculoskeletal:        General: No deformity.  Neurological:     Mental Status: She is alert and oriented to person, place, and time.     Coordination: Coordination normal.  Psychiatric:        Attention and Perception: Attention and perception normal. She does not perceive auditory or visual hallucinations.        Mood and Affect: Mood normal. Mood is not anxious or depressed. Affect is not labile, blunt, angry or inappropriate.        Speech: Speech normal.        Behavior: Behavior normal.        Thought Content: Thought content normal. Thought content is not paranoid or delusional. Thought content does not include homicidal or suicidal ideation. Thought content does not include homicidal or suicidal plan.        Cognition and Memory: Cognition and memory  normal.        Judgment: Judgment normal.     Comments: Insight intact     Lab Review:     Component Value Date/Time   NA 136 05/20/2022 0346   NA 142 02/16/2014 1501   K 3.6 05/20/2022 0346   CL 100 05/20/2022 0346   CO2 27 05/20/2022 0346   GLUCOSE 87 05/20/2022 0346   BUN 14 05/20/2022 0346   BUN 13 02/16/2014 1501   CREATININE 1.18 05/20/2022 0346   CALCIUM 9.0 05/20/2022 0346   PROT 7.5 05/19/2022 0320   PROT 6.6 02/16/2014 1501   ALBUMIN 4.6 05/19/2022 0320   ALBUMIN 4.9 02/16/2014 1501   AST 213 (H) 05/19/2022 0320   ALT 193 (H) 05/19/2022 0320   ALKPHOS 70 05/19/2022 0320  BILITOT 1.2 05/19/2022 0320   GFRNONAA >60 05/20/2022 0346   GFRAA >60 02/22/2019 0239       Component Value Date/Time   WBC 4.8 05/22/2022 0309   RBC 4.35 05/22/2022 0309   HGB 13.0 05/22/2022 0309   HCT 37.5 (L) 05/22/2022 0309   PLT 250 05/22/2022 0309   MCV 86.2 05/22/2022 0309   MCV 86.9 08/01/2013 1040   MCH 29.9 05/22/2022 0309   MCHC 34.7 05/22/2022 0309   RDW 11.2 (L) 05/22/2022 0309    No results found for: "POCLITH", "LITHIUM"   No results found for: "PHENYTOIN", "PHENOBARB", "VALPROATE", "CBMZ"   .res Assessment: Plan:    Plan:  D/C Rexulti 0.'5mg'$  daily - not taking Restart Trazadone '50mg'$  - 1 to 2 at hs Restart Lexapro '20mg'$  daily - 1/2 tablet daily x 7 days, then one tablet daily. Continue Wellbutrin XL '450mg'$   Restart Aderall XR '20mg'$  BID   Monitor BP between visits  RTC 6 months  Patient advised to contact office with any questions, adverse effects, or acute worsening in signs and symptoms.  Discussed potential benefits, risks, and side effects of stimulants with patient to include increased heart rate, palpitations, insomnia, increased anxiety, increased irritability, or decreased appetite.  Instructed patient to contact office if experiencing any significant tolerability issues.  Diagnoses and all orders for this visit:  Generalized anxiety disorder -      escitalopram (LEXAPRO) 20 MG tablet; Take 1/2 tablet daily for 7 days, then increase to one tablet daily.  Major depressive disorder, recurrent episode, moderate (HCC) -     buPROPion (WELLBUTRIN XL) 150 MG 24 hr tablet; Take 3 tablets (450 mg total) by mouth every morning. -     escitalopram (LEXAPRO) 20 MG tablet; Take 1/2 tablet daily for 7 days, then increase to one tablet daily.  Attention deficit hyperactivity disorder (ADHD), unspecified ADHD type -     buPROPion (WELLBUTRIN XL) 150 MG 24 hr tablet; Take 3 tablets (450 mg total) by mouth every morning. -     amphetamine-dextroamphetamine (ADDERALL XR) 20 MG 24 hr capsule; Take 1 capsule (20 mg total) by mouth 2 (two) times daily.  Insomnia, unspecified type -     traZODone (DESYREL) 50 MG tablet; Take one to two tablets at bedtime as needed.     Please see After Visit Summary for patient specific instructions.  No future appointments.  No orders of the defined types were placed in this encounter.   -------------------------------

## 2022-10-22 ENCOUNTER — Other Ambulatory Visit: Payer: Self-pay | Admitting: Adult Health

## 2022-10-22 DIAGNOSIS — F331 Major depressive disorder, recurrent, moderate: Secondary | ICD-10-CM

## 2022-10-22 DIAGNOSIS — F411 Generalized anxiety disorder: Secondary | ICD-10-CM

## 2022-10-22 DIAGNOSIS — F909 Attention-deficit hyperactivity disorder, unspecified type: Secondary | ICD-10-CM

## 2022-11-06 ENCOUNTER — Other Ambulatory Visit: Payer: Self-pay | Admitting: Adult Health

## 2022-11-06 DIAGNOSIS — G4700 Insomnia, unspecified: Secondary | ICD-10-CM

## 2022-11-16 ENCOUNTER — Encounter: Payer: Self-pay | Admitting: Adult Health

## 2022-11-16 ENCOUNTER — Ambulatory Visit: Payer: 59 | Admitting: Adult Health

## 2022-11-16 DIAGNOSIS — G4700 Insomnia, unspecified: Secondary | ICD-10-CM

## 2022-11-16 DIAGNOSIS — F4310 Post-traumatic stress disorder, unspecified: Secondary | ICD-10-CM

## 2022-11-16 DIAGNOSIS — F331 Major depressive disorder, recurrent, moderate: Secondary | ICD-10-CM

## 2022-11-16 DIAGNOSIS — F909 Attention-deficit hyperactivity disorder, unspecified type: Secondary | ICD-10-CM | POA: Diagnosis not present

## 2022-11-16 DIAGNOSIS — F411 Generalized anxiety disorder: Secondary | ICD-10-CM

## 2022-11-16 MED ORDER — AMPHETAMINE-DEXTROAMPHET ER 20 MG PO CP24: 20.0000 mg | ORAL_CAPSULE | Freq: Two times a day (BID) | ORAL | 0 refills | Status: DC

## 2022-11-16 MED ORDER — REXULTI 0.5 MG PO TABS: 1.0000 | ORAL_TABLET | Freq: Every day | ORAL | 0 refills | Status: DC

## 2022-11-16 NOTE — Progress Notes (Signed)
Antonio [name] [id number] 1986-01-10 37 y.o.  Subjective:   Patient ID:  Antonio [name] is a 37 y.o. (DOB 1985/12/29) adult.  Chief Complaint: No chief complaint on file.   HPI Antonio [name] presents to the office today for follow-up of anxiety, depression, insomnia, ADHD and PTSD.  Describes mood today as "ok". Pleasant. Denies tearfukness. Mood symptoms - reports depression - "a little bit". Denies anxiety and irritability. Denies worry, rumination, and over thinking. Mood is consistent. Stating "I'm doing ok". Feels like current medication regimen is helpful - does not take medications on days off - "working on it". Plans to make an appointment with PCP for a check up. Working full time English as a second language teacher. He and wife doing well. Varying interest and motivation. Taking medications as prescribed.  Energy levels lower. Active, does not have a regular exercise routine.  Enjoys some usual interests and activities. Married. Lives with wife and dog - "[name]". Family local.  Appetite stable. Weight stable - 140 to 150 pounds. Sleeps well most nights. Averages 6 hours when working and more when off. Focus and concentration stable - using Adderall as needed. Completing tasks. Managing aspects of household. 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  Denies SI or HI.  Denies AH or VH. Denies self harm. Denies substance use.  CAGE-AID    Flowsheet Row ED to Hosp-Admission (Discharged) from 05/19/2022 in [facility]  CAGE-AID Score 3      PHQ2-9    Flowsheet Row Nutrition from 09/01/2020 in [facility] Visit from 12/24/2013 in [facility] Office Visit from 10/09/2013 in [facility]  PHQ-2 Total Score 0 0 0      Flowsheet Row ED to Hosp-Admission (Discharged) from 05/19/2022 in [facility]  C-SSRS RISK CATEGORY No  Risk        Review of Systems:  Review of Systems  Musculoskeletal:  Negative for gait problem.  Neurological:  Negative for tremors.  Psychiatric/Behavioral:         Please refer to HPI    Medications: I have reviewed the patient's current medications.  Current Outpatient Medications  Medication Sig Dispense Refill   acetaminophen (TYLENOL) 500 MG tablet Take 2 tablets (1,000 mg total) by mouth every 8 (eight) hours as needed. 30 tablet 0   amphetamine-dextroamphetamine (ADDERALL XR) 20 MG 24 hr capsule Take 1 capsule (20 mg total) by mouth 2 (two) times daily. 60 capsule 0   buPROPion (WELLBUTRIN XL) 150 MG 24 hr tablet TAKE 3 TABLETS BY MOUTH EVERY MORNING. 270 tablet 0   COVID-19 mRNA vaccine, Moderna, 100 MCG/0.5ML injection      escitalopram (LEXAPRO) 20 MG tablet Take one tablet daily. 90 tablet 0   folic acid (FOLVITE) 1 MG tablet Take 1 tablet (1 mg total) by mouth daily.     Multiple Vitamin (MULTIVITAMIN WITH MINERALS) TABS tablet Take 1 tablet by mouth daily.     oxyCODONE (OXY IR/ROXICODONE) 5 MG immediate release tablet Take 1 tablet (5 mg total) by mouth every 6 (six) hours as needed for breakthrough pain. 15 tablet 0   pantoprazole (PROTONIX) 40 MG tablet Take 1 tablet (40 mg total) by mouth daily. (Patient not taking: Reported on 05/19/2022) 30 tablet 3   REXULTI 0.5 MG TABS Take 1 tablet (0.5 mg total) by mouth daily. 90 tablet 0   thiamine (VITAMIN B-1)  100 MG tablet Take 1 tablet (100 mg total) by mouth daily.     traZODone (DESYREL) 50 MG tablet TAKE ONE TO TWO TABLETS AT BEDTIME AS NEEDED. 180 tablet 0   No current facility-administered medications for this visit.    Medication Side Effects: None  Allergies: No Known Allergies  Past Medical History:  Diagnosis Date   Anxiety 11/02/2015   Chronic pain of left ankle 04/24/2018   Depression 11/02/2015   Gastroesophageal reflux disease    question underlying stricture as pt is vomiting up undigested food  particles. will try to obtain the ugi asap as he cannot be scheduled until feb 22.  spoke with dr Clide Cliff who cannot add onto the schedule.  will attempt to schedule thru [facility] off [facility] asap  Dec 04, 2008 Entered By: Carrington Clamp R Comment: nl EGD 1/09   GERD (gastroesophageal reflux disease)    Hemangioma 01/24/2016   HLD (hyperlipidemia)    Nephrolithiasis 05/19/2022   OSA on CPAP 01/06/2016   Home sleep study: 12/22/2015 Mild Sleep Apnea  Total apnea events over 3.6 hours = 22 Hypoxemia events for an index of 7.9 hours=48 10 obstructive apneas 11 central apneas 1 mixed apnea AHI= 11.6 per hour of sleep Cheyne-Stokes respirations were not observed Hypoventilation was not observed 41 desaturations occurred during the study Lowest saturation was 91% with an average of 95% Minimum saturati   Pityriasis versicolor 02/04/2019   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   Suicidal ideation 02/04/2019   Vitamin D deficiency     Past Medical History, Surgical history, Social history, and Family history were reviewed and updated as appropriate.   Please see review of systems for further details on the patient's review from today.   Objective:   Physical Exam:  There were no vitals taken for this visit.  Physical Exam Constitutional:      General: She is not in acute distress. Musculoskeletal:        General: No deformity.  Neurological:     Mental Status: She is alert and oriented to person, place, and time.     Coordination: Coordination normal.  Psychiatric:        Attention and Perception: Attention and perception normal. She does not perceive auditory or visual hallucinations.        Mood and Affect: Mood normal. Mood is not anxious or depressed. Affect is not labile, blunt, angry or inappropriate.        Speech: Speech normal.        Behavior: Behavior normal.        Thought Content: Thought content normal. Thought content is not paranoid or delusional. Thought content does not  include homicidal or suicidal ideation. Thought content does not include homicidal or suicidal plan.        Cognition and Memory: Cognition and memory normal.        Judgment: Judgment normal.     Comments: Insight intact     Lab Review:     Component Value Date/Time   NA 136 05/20/2022 0346   NA 142 02/16/2014 1501   K 3.6 05/20/2022 0346   CL 100 05/20/2022 0346   CO2 27 05/20/2022 0346   GLUCOSE 87 05/20/2022 0346   BUN 14 05/20/2022 0346   BUN 13 02/16/2014 1501   CREATININE 1.18 05/20/2022 0346   CALCIUM 9.0 05/20/2022 0346   PROT 7.5 05/19/2022 0320   PROT 6.6 02/16/2014 1501   ALBUMIN 4.6 05/19/2022 0320   ALBUMIN 4.9 02/16/2014  1501   AST 213 (H) 05/19/2022 0320   ALT 193 (H) 05/19/2022 0320   ALKPHOS 70 05/19/2022 0320   BILITOT 1.2 05/19/2022 0320   GFRNONAA >60 05/20/2022 0346   GFRAA >60 02/22/2019 0239       Component Value Date/Time   WBC 4.8 05/22/2022 0309   RBC 4.35 05/22/2022 0309   HGB 13.0 05/22/2022 0309   HCT 37.5 (L) 05/22/2022 0309   PLT 250 05/22/2022 0309   MCV 86.2 05/22/2022 0309   MCV 86.9 08/01/2013 1040   MCH 29.9 05/22/2022 0309   MCHC 34.7 05/22/2022 0309   RDW 11.2 (L) 05/22/2022 0309    No results found for: "POCLITH", "LITHIUM"   No results found for: "PHENYTOIN", "PHENOBARB", "VALPROATE", "CBMZ"   .res Assessment: Plan:    Plan:  Rexulti 0.5mg  daily Trazadone  - 1 to 2 at hs Lexapro  daily  Wellbutrin XL   Aderall XR  BID   Plans to try and start taking medications more consistently. Plans to see PCP upcoming.  Monitor BP between visits while taking stimulant medication.   RTC 6 months  Patient advised to contact office with any questions, adverse effects, or acute worsening in signs and symptoms.  Discussed potential benefits, risks, and side effects of stimulants with patient to include increased heart rate, palpitations, insomnia, increased anxiety, increased irritability, or decreased  appetite.  Instructed patient to contact office if experiencing any significant tolerability issues.  Diagnoses and all orders for this visit:  Major depressive disorder, recurrent episode, moderate -     REXULTI 0.5 MG TABS; Take 1 tablet (0.5 mg total) by mouth daily.  Attention deficit hyperactivity disorder (ADHD), unspecified ADHD type -     amphetamine-dextroamphetamine (ADDERALL XR) 20 MG 24 hr capsule; Take 1 capsule (20 mg total) by mouth 2 (two) times daily.     Please see After Visit Summary for patient specific instructions.  No future appointments.  No orders of the defined types were placed in this encounter.   -------------------------------

## 2023-02-14 ENCOUNTER — Ambulatory Visit (INDEPENDENT_AMBULATORY_CARE_PROVIDER_SITE_OTHER): Payer: Self-pay | Admitting: Adult Health

## 2023-02-14 DIAGNOSIS — Z0389 Encounter for observation for other suspected diseases and conditions ruled out: Secondary | ICD-10-CM

## 2023-02-14 NOTE — Progress Notes (Signed)
Patient no show appointment. ? ?

## 2023-02-16 ENCOUNTER — Ambulatory Visit (HOSPITAL_BASED_OUTPATIENT_CLINIC_OR_DEPARTMENT_OTHER)
Admission: EM | Admit: 2023-02-16 | Discharge: 2023-02-16 | Disposition: A | Discharge status: Home / Self Care | Payer: 59 | Attending: Urology | Admitting: Urology

## 2023-02-16 ENCOUNTER — Emergency Department (HOSPITAL_BASED_OUTPATIENT_CLINIC_OR_DEPARTMENT_OTHER): Payer: 59

## 2023-02-16 ENCOUNTER — Emergency Department (HOSPITAL_BASED_OUTPATIENT_CLINIC_OR_DEPARTMENT_OTHER): Payer: 59 | Admitting: Certified Registered Nurse Anesthetist

## 2023-02-16 ENCOUNTER — Emergency Department (HOSPITAL_COMMUNITY): Payer: 59 | Admitting: Certified Registered Nurse Anesthetist

## 2023-02-16 ENCOUNTER — Other Ambulatory Visit: Payer: Self-pay

## 2023-02-16 ENCOUNTER — Encounter (HOSPITAL_BASED_OUTPATIENT_CLINIC_OR_DEPARTMENT_OTHER): Payer: Self-pay | Admitting: Emergency Medicine

## 2023-02-16 ENCOUNTER — Encounter (HOSPITAL_COMMUNITY)
Admission: EM | Disposition: A | Discharge status: Home / Self Care | Payer: Self-pay | Source: Home / Self Care | Attending: Emergency Medicine

## 2023-02-16 DIAGNOSIS — X58XXXA Exposure to other specified factors, initial encounter: Secondary | ICD-10-CM | POA: Insufficient documentation

## 2023-02-16 DIAGNOSIS — S30201A Contusion of unspecified external genital organ, male, initial encounter: Secondary | ICD-10-CM

## 2023-02-16 DIAGNOSIS — S3022XA Contusion of scrotum and testes, initial encounter: Secondary | ICD-10-CM | POA: Insufficient documentation

## 2023-02-16 DIAGNOSIS — G4733 Obstructive sleep apnea (adult) (pediatric): Secondary | ICD-10-CM | POA: Insufficient documentation

## 2023-02-16 DIAGNOSIS — N50819 Testicular pain, unspecified: Secondary | ICD-10-CM

## 2023-02-16 DIAGNOSIS — K219 Gastro-esophageal reflux disease without esophagitis: Secondary | ICD-10-CM | POA: Insufficient documentation

## 2023-02-16 DIAGNOSIS — S3130XA Unspecified open wound of scrotum and testes, initial encounter: Secondary | ICD-10-CM | POA: Diagnosis not present

## 2023-02-16 DIAGNOSIS — S3994XA Unspecified injury of external genitals, initial encounter: Secondary | ICD-10-CM

## 2023-02-16 HISTORY — PX: ORCHIECTOMY: SHX2116

## 2023-02-16 LAB — URINALYSIS, ROUTINE W REFLEX MICROSCOPIC
Bilirubin Urine: NEGATIVE
Glucose, UA: NEGATIVE mg/dL
Hgb urine dipstick: NEGATIVE
Ketones, ur: NEGATIVE mg/dL
Leukocytes,Ua: NEGATIVE
Nitrite: NEGATIVE
Protein, ur: NEGATIVE mg/dL
Specific Gravity, Urine: >=1.03 (ref 1.005–1.030)
pH: 5.5 (ref 5.0–8.0)

## 2023-02-16 LAB — CBC WITH DIFFERENTIAL/PLATELET
Abs Immature Granulocytes: 0.05 10*3/uL (ref 0.00–0.07)
Basophils Absolute: 0 10*3/uL (ref 0.0–0.1)
Basophils Relative: 0 %
Eosinophils Absolute: 0 10*3/uL (ref 0.0–0.5)
Eosinophils Relative: 0 %
HCT: 39.2 % (ref 39.0–52.0)
Hemoglobin: 13.7 g/dL (ref 13.0–17.0)
Immature Granulocytes: 0 %
Lymphocytes Relative: 7 %
Lymphs Abs: 1 10*3/uL (ref 0.7–4.0)
MCH: 29.8 pg (ref 26.0–34.0)
MCHC: 34.9 g/dL (ref 30.0–36.0)
MCV: 85.2 fL (ref 80.0–100.0)
Monocytes Absolute: 1.5 10*3/uL — ABNORMAL HIGH (ref 0.1–1.0)
Monocytes Relative: 10 %
Neutro Abs: 12.5 10*3/uL — ABNORMAL HIGH (ref 1.7–7.7)
Neutrophils Relative %: 83 %
Platelets: 353 10*3/uL (ref 150–400)
RBC: 4.6 MIL/uL (ref 4.22–5.81)
RDW: 11.5 % (ref 11.5–15.5)
WBC: 15.1 10*3/uL — ABNORMAL HIGH (ref 4.0–10.5)
nRBC: 0 % (ref 0.0–0.2)

## 2023-02-16 LAB — COMPREHENSIVE METABOLIC PANEL
ALT: 19 U/L (ref 0–44)
AST: 25 U/L (ref 15–41)
Albumin: 4.3 g/dL (ref 3.5–5.0)
Alkaline Phosphatase: 76 U/L (ref 38–126)
Anion gap: 12 (ref 5–15)
BUN: 13 mg/dL (ref 6–20)
CO2: 25 mmol/L (ref 22–32)
Calcium: 8.6 mg/dL — ABNORMAL LOW (ref 8.9–10.3)
Chloride: 101 mmol/L (ref 98–111)
Creatinine, Ser: 1.02 mg/dL (ref 0.61–1.24)
GFR, Estimated: >60 mL/min (ref >60)
Glucose, Bld: 94 mg/dL (ref 70–99)
Potassium: 3.7 mmol/L (ref 3.5–5.1)
Sodium: 138 mmol/L (ref 135–145)
Total Bilirubin: 0.8 mg/dL (ref 0.3–1.2)
Total Protein: 7.1 g/dL (ref 6.5–8.1)

## 2023-02-16 LAB — ETHANOL: Alcohol, Ethyl (B): 177 mg/dL — ABNORMAL HIGH (ref <10)

## 2023-02-16 LAB — LIPASE, BLOOD: Lipase: 27 U/L (ref 11–51)

## 2023-02-16 SURGERY — ORCHIECTOMY
Anesthesia: General | Laterality: Right

## 2023-02-16 MED ORDER — 0.9 % SODIUM CHLORIDE (POUR BTL) OPTIME
TOPICAL | Status: DC | PRN
  Administered 2023-02-16: 1000 mL

## 2023-02-16 MED ORDER — DEXAMETHASONE SODIUM PHOSPHATE 10 MG/ML IJ SOLN
INTRAMUSCULAR | Status: DC | PRN
  Administered 2023-02-16: 8 mg via INTRAVENOUS

## 2023-02-16 MED ORDER — FENTANYL CITRATE PF 50 MCG/ML IJ SOSY: 25.0000 ug | PREFILLED_SYRINGE | INTRAMUSCULAR | Status: DC | PRN

## 2023-02-16 MED ORDER — PHENYLEPHRINE HCL (PRESSORS) 10 MG/ML IV SOLN
INTRAVENOUS | Status: DC | PRN
  Administered 2023-02-16: 100 ug via INTRAVENOUS

## 2023-02-16 MED ORDER — ONDANSETRON HCL 4 MG/2ML IJ SOLN
4.0000 mg | Freq: Once | INTRAMUSCULAR | Status: AC
  Administered 2023-02-16: 4 mg via INTRAVENOUS

## 2023-02-16 MED ORDER — IOHEXOL 300 MG/ML  SOLN
100.0000 mL | Freq: Once | INTRAMUSCULAR | Status: AC | PRN
  Administered 2023-02-16: 100 mL via INTRAVENOUS

## 2023-02-16 MED ORDER — ROCURONIUM BROMIDE 100 MG/10ML IV SOLN
INTRAVENOUS | Status: DC | PRN
  Administered 2023-02-16: 50 mg via INTRAVENOUS

## 2023-02-16 MED ORDER — MORPHINE SULFATE (PF) 4 MG/ML IV SOLN
4.0000 mg | Freq: Once | INTRAVENOUS | Status: AC
  Administered 2023-02-16: 4 mg via INTRAVENOUS

## 2023-02-16 MED ORDER — SODIUM CHLORIDE 0.9 % IV BOLUS
1000.0000 mL | Freq: Once | INTRAVENOUS | Status: AC
  Administered 2023-02-16: 1000 mL via INTRAVENOUS

## 2023-02-16 MED ORDER — ONDANSETRON HCL 4 MG/2ML IJ SOLN
INTRAMUSCULAR | Status: DC | PRN
  Administered 2023-02-16: 4 mg via INTRAVENOUS

## 2023-02-16 MED ORDER — DEXMEDETOMIDINE HCL IN NACL 200 MCG/50ML IV SOLN
INTRAVENOUS | Status: DC | PRN
  Administered 2023-02-16 (×2): 4 ug via INTRAVENOUS

## 2023-02-16 MED ORDER — LACTATED RINGERS IV SOLN: INTRAVENOUS | Status: DC

## 2023-02-16 MED ORDER — BUPIVACAINE HCL (PF) 0.25 % IJ SOLN
INTRAMUSCULAR | Status: DC | PRN
  Administered 2023-02-16: 15 mL

## 2023-02-16 MED ORDER — ACETAMINOPHEN 10 MG/ML IV SOLN: 1000.0000 mg | Freq: Once | INTRAVENOUS | Status: DC | PRN

## 2023-02-16 MED ORDER — HYDROCODONE-ACETAMINOPHEN 7.5-325 MG PO TABS: 1.0000 | ORAL_TABLET | Freq: Four times a day (QID) | ORAL | 0 refills | Status: AC | PRN

## 2023-02-16 MED ORDER — CEFAZOLIN SODIUM-DEXTROSE 2-4 GM/100ML-% IV SOLN
2.0000 g | Freq: Once | INTRAVENOUS | Status: AC
  Administered 2023-02-16: 2 g via INTRAVENOUS

## 2023-02-16 MED ORDER — SUGAMMADEX SODIUM 200 MG/2ML IV SOLN
INTRAVENOUS | Status: DC | PRN
  Administered 2023-02-16: 200 mg via INTRAVENOUS

## 2023-02-16 MED ORDER — LIDOCAINE HCL (CARDIAC) PF 100 MG/5ML IV SOSY
PREFILLED_SYRINGE | INTRAVENOUS | Status: DC | PRN
  Administered 2023-02-16: 60 mg via INTRAVENOUS

## 2023-02-16 MED ORDER — PROPOFOL 10 MG/ML IV BOLUS
INTRAVENOUS | Status: DC | PRN
Start: 2023-02-16 — End: 2023-02-16
  Administered 2023-02-16: 150 mg via INTRAVENOUS

## 2023-02-16 MED ORDER — FENTANYL CITRATE (PF) 100 MCG/2ML IJ SOLN
INTRAMUSCULAR | Status: DC | PRN
  Administered 2023-02-16 (×2): 50 ug via INTRAVENOUS

## 2023-02-16 MED ORDER — MIDAZOLAM HCL 5 MG/5ML IJ SOLN
INTRAMUSCULAR | Status: DC | PRN
  Administered 2023-02-16: 2 mg via INTRAVENOUS

## 2023-02-16 MED ORDER — CHLORHEXIDINE GLUCONATE 0.12 % MT SOLN
15.0000 mL | Freq: Once | OROMUCOSAL | Status: AC
  Administered 2023-02-16: 15 mL via OROMUCOSAL

## 2023-02-16 MED ORDER — ORAL CARE MOUTH RINSE: 15.0000 mL | Freq: Once | OROMUCOSAL | Status: AC

## 2023-02-16 MED ORDER — CEPHALEXIN 500 MG PO CAPS: 500.0000 mg | ORAL_CAPSULE | Freq: Two times a day (BID) | ORAL | 0 refills | Status: AC

## 2023-02-16 SURGICAL SUPPLY — 30 items
BAG COUNTER SPONGE SURGICOUNT (BAG) IMPLANT
BAG SPNG CNTER NS LX DISP (BAG)
BNDG GAUZE DERMACEA FLUFF 4 (GAUZE/BANDAGES/DRESSINGS) ×1 IMPLANT
BNDG GZE DERMACEA 4 6PLY (GAUZE/BANDAGES/DRESSINGS) ×1
CATH ROBINSON RED A/P 16FR (CATHETERS) IMPLANT
COVER SURGICAL LIGHT HANDLE (MISCELLANEOUS) ×1 IMPLANT
DRAIN PENROSE 0.25X18 (DRAIN) IMPLANT
DRSG TELFA PLUS 4X6 ADH ISLAND (GAUZE/BANDAGES/DRESSINGS) IMPLANT
ELECT REM PT RETURN 15FT ADLT (MISCELLANEOUS) ×1 IMPLANT
GLOVE SURG LX STRL 7.5 STRW (GLOVE) ×1 IMPLANT
GOWN STRL REUS W/ TWL XL LVL3 (GOWN DISPOSABLE) ×1 IMPLANT
GOWN STRL REUS W/TWL XL LVL3 (GOWN DISPOSABLE) ×1
KIT BASIN OR (CUSTOM PROCEDURE TRAY) ×1 IMPLANT
KIT TURNOVER KIT A (KITS) IMPLANT
NDL HYPO 22X1.5 SAFETY MO (MISCELLANEOUS) IMPLANT
NEEDLE HYPO 22X1.5 SAFETY MO (MISCELLANEOUS) IMPLANT
NS IRRIG 1000ML POUR BTL (IV SOLUTION) ×1 IMPLANT
PACK GENERAL/GYN (CUSTOM PROCEDURE TRAY) ×1 IMPLANT
SUPPORT SCROTAL LG STRP (MISCELLANEOUS) ×1 IMPLANT
SUPPORT SCROTAL MED ADLT STRP (MISCELLANEOUS) IMPLANT
SUT CHROMIC 3 0 SH 27 (SUTURE) ×2 IMPLANT
SUT CHROMIC 4 0 RB 1X27 (SUTURE) IMPLANT
SUT PDS AB 3-0 SH 27 (SUTURE) IMPLANT
SUT VIC AB 2-0 SH 27 (SUTURE) ×1
SUT VIC AB 2-0 SH 27X BRD (SUTURE) IMPLANT
SUT VIC AB 2-0 UR5 27 (SUTURE) IMPLANT
SUT VICRYL 0 TIES 12 18 (SUTURE) IMPLANT
SYR CONTROL 10ML LL (SYRINGE) IMPLANT
TOWEL OR 17X26 10 PK STRL BLUE (TOWEL DISPOSABLE) ×2 IMPLANT
WATER STERILE IRR 1000ML POUR (IV SOLUTION) ×1 IMPLANT

## 2023-02-16 NOTE — ED Notes (Signed)
[facility] called and notified of transfer

## 2023-02-16 NOTE — ED Notes (Signed)
Pt in bed, pt states that his pain is a 5/10 and he doesn't need anything for pain at this time, resps even and unlabored. Pt awaits ultrasound result.

## 2023-02-16 NOTE — ED Notes (Addendum)
Report called to [facility] ED charge for an ED to ED transfer.  MD Steinel accepting

## 2023-02-16 NOTE — Anesthesia Procedure Notes (Signed)
Procedure Name: Intubation Date/Time: 02/16/2023 3:09 PM  Performed by: Johnette Abraham, CRNAPre-anesthesia Checklist: Patient identified, Emergency Drugs available, Suction available and Patient being monitored Patient Re-evaluated:Patient Re-evaluated prior to induction Oxygen Delivery Method: Circle System Utilized Preoxygenation: Pre-oxygenation with 100% oxygen Induction Type: IV induction Ventilation: Mask ventilation without difficulty Laryngoscope Size: Mac and 4 Grade View: Grade II Tube type: Oral Tube size: 7.5 mm Number of attempts: 1 Airway Equipment and Method: Stylet and Oral airway Placement Confirmation: ETT inserted through vocal cords under direct vision, positive ETCO2 and breath sounds checked- equal and bilateral Secured at: 23 cm Tube secured with: Tape Dental Injury: Teeth and Oropharynx as per pre-operative assessment
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
## 2023-02-16 NOTE — Anesthesia Preprocedure Evaluation (Addendum)
Anesthesia Evaluation  Patient identified by MRN, date of birth, ID band Patient awake    Reviewed: Allergy & Precautions, NPO status , Patient's Chart, lab work & pertinent test results  Airway Mallampati: II  TM Distance: >3 FB Neck ROM: Full    Dental no notable dental hx.    Pulmonary sleep apnea    Pulmonary exam normal        Cardiovascular negative cardio ROS  Rhythm:Regular Rate:Normal     Neuro/Psych   Anxiety Depression    negative neurological ROS     GI/Hepatic Neg liver ROS,GERD  Medicated,,  Endo/Other  negative endocrine ROS    Renal/GU   negative genitourinary   Musculoskeletal negative musculoskeletal ROS (+)    Abdominal Normal abdominal exam  (+)   Peds  Hematology Lab Results      Component                Value               Date                      WBC                      15.1 (H)            02/16/2023                HGB                      13.7                02/16/2023                HCT                      39.2                02/16/2023                MCV                      85.2                02/16/2023                PLT                      353                 02/16/2023              Anesthesia Other Findings   Reproductive/Obstetrics Testicular rupture 2/2 trauma                             Anesthesia Physical Anesthesia Plan  ASA: 2 and emergent  Anesthesia Plan: General   Post-op Pain Management:    Induction: Intravenous  PONV Risk Score and Plan: 2 and Ondansetron, Dexamethasone, Midazolam and Treatment may vary due to age or medical condition  Airway Management Planned: Mask and Oral ETT  Additional Equipment: None  Intra-op Plan:   Post-operative Plan: Extubation in OR  Informed Consent: I have reviewed the patients History and Physical, chart, labs and discussed the procedure including the risks, benefits and alternatives for the  proposed anesthesia with the patient or  authorized representative who has indicated his/her understanding and acceptance.     Dental advisory given  Plan Discussed with: CRNA  Anesthesia Plan Comments:        Anesthesia Quick Evaluation

## 2023-02-16 NOTE — ED Provider Notes (Signed)
[place] from [place], pain well controlled at this time. Testicular ruputre. To go to OR with urology here within the next hour, OR is ready for patient. HDS throughout my care.   This chart was dictated using voice recognition software.  Despite best efforts to proofread,  errors can occur which can change the documentation meaning.    Virgina Norfolk, DO 02/16/23 1320

## 2023-02-16 NOTE — Transfer of Care (Signed)
Immediate Anesthesia Transfer of Care Note  Patient: Antonio [name]  Procedure(s) Performed: Karen Kays EXPLORATION WITH RIGHT TESTICULAR REPAIR (Right)  Patient Location: PACU  Anesthesia Type:General  Level of Consciousness: drowsy and patient cooperative  Airway & Oxygen Therapy: Patient Spontanous Breathing and Patient connected to face mask oxygen  Post-op Assessment: Report given to RN and Post -op Vital signs reviewed and stable  Post vital signs: Reviewed and stable  Last Vitals:  Vitals Value Taken Time  BP 107/65 02/16/23 1645  Temp 36.7 C 02/16/23 1645  Pulse 102 02/16/23 1648  Resp 15 02/16/23 1648  SpO2 98 % 02/16/23 1648  Vitals shown include unfiled device data.  Last Pain:  Vitals:   02/16/23 1355  TempSrc: Oral  PainSc: 4       Patients Stated Pain Goal: 4 (02/16/23 1355)  Complications: No notable events documented.

## 2023-02-16 NOTE — ED Notes (Signed)
Pt in bed, pt requests pain medication, MD notified

## 2023-02-16 NOTE — ED Notes (Signed)
Pt in bed, pain med given, pt states that he feels like his scrotum is getting bigger.  Pt has significant swelling and bruising to his scrotum.

## 2023-02-16 NOTE — ED Notes (Signed)
Ultrasound at bedside for exam
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
## 2023-02-16 NOTE — ED Notes (Signed)
Unable to scan meds due to computers being down
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
## 2023-02-16 NOTE — Discharge Instructions (Addendum)
Scrotal exploration and repair of Right testicle, Adult, Care After  The following information offers guidance on how to care for yourself after your procedure. Your health care provider may also give you more specific instructions. If you have problems or questions, contact your health care provider. What can I expect after the procedure? After your procedure, it is common to have: Mild discomfort and swelling in the pouch that holds your testicles (scrotum). Bruising of the scrotum. Follow these instructions at home: Medicines Take over-the-counter and prescription medicines only as told by your health care provider. Ask your health care provider if the medicine prescribed to you: Requires you to avoid driving or using machinery. Can cause constipation. You may need to take these actions to prevent or treat constipation: Drink enough fluid to keep your urine pale yellow. Take over-the-counter or prescription medicines. Eat foods that are high in fiber, such as beans, whole grains, and fresh fruits and vegetables. Limit foods that are high in fat and processed sugars, such as fried or sweet foods. Bathing Do not take baths, swim, or use a hot tub until your health care provider approves.  You may shower tomorrow but do not spray the incision or scrotum directly.  wear an athletic support strap (scrotal support) until the swelling goes down. Take it off when you shower or bathe. Incision care  Follow instructions from your health care provider about how to take care of your incision. Make sure you: Wash your hands with soap and water for at least 20 seconds before and after you change your bandage (dressing). If soap and water are not available, use hand sanitizer. Change your dressing as told by your health care provider. Leave stitches (sutures), skin glue, or adhesive strips in place. These skin closures may need to stay in place for 2 weeks or longer. If adhesive strip edges start to  loosen and curl up, you may trim the loose edges. Do not remove adhesive strips completely unless your health care provider tells you to do that. Check your incision and scrotum every day for signs of infection. Check for: More redness, swelling, or pain. Fluid or blood. Warmth. Pus or a bad smell. Managing pain and swelling  Put ice on the affected area for the next two days:  To do this: Put ice in a plastic bag. Place a towel between your skin and the bag. Leave the ice on for 20 minutes, 3-4 times per day. Remove the ice if your skin turns bright red. This is very important. If you cannot feel pain, heat, or cold, you have a greater risk of damage to the area.   Activity Do not lift anything that is heavier than 10 lb (4.5 kg) until your health care provider says that it is safe. If you were given a sedative during the procedure, it can affect you for several hours. Do not drive or operate machinery until your health care provider says that it is safe. Ask your health care provider when it is safe to drive. Return to your normal activities as told by your health care provider. Ask your health care provider what activities are safe for you. General instructions Do not use any products that contain nicotine or tobacco. These products include cigarettes, chewing tobacco, and vaping devices, such as e-cigarettes. These can delay healing after surgery. If you need help quitting, ask your health care provider. If you were given a scrotal support, wear it as told by your health care provider. Keep  all follow-up visits. This is important. If you had a drain put in during the procedure, you will need to have it removed at a follow-up visit. Contact a health care provider if: Your pain is not controlled with medicine. You have more redness, swelling, or pain around your scrotum. You have fluid or blood coming from your incision. Your incision feels warm to the touch. You have pus or a bad  smell coming from your incision. You have a fever. Get help right away if: You develop shaking, chills, and a fever that is higher than 101.54F (38.8C). You have redness or swelling that starts at your scrotum and spreads outward to your whole groin. You develop swelling in your legs. You have difficulty breathing. These symptoms may be an emergency. Get help right away. Call 911. Do not wait to see if the symptoms will go away. Do not drive yourself to the hospital. Summary After a testicle injury, it is common to have mild discomfort, swelling, and bruising. Do not take baths, swim, or use a hot tub until your health care provider approves. Ask your health care provider if you may take showers. If directed, put ice on the affected area to help with pain and swelling. Do not lift anything that is heavier than 10 lb (4.5 kg) until your health care provider says that it is safe. Return to your normal activities as told by your health care provider. If you were given a scrotal support, keep it dry. Wear the scrotal support as told by your health care provider. This information is not intended to replace advice given to you by your health care provider. Make sure you discuss any questions you have with your health care provider. Document Revised: 03/03/2021 Document Reviewed: 03/03/2021 Elsevier Patient Education  2024 [employer].
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
## 2023-02-17 ENCOUNTER — Encounter (HOSPITAL_COMMUNITY): Payer: Self-pay | Admitting: Urology

## 2023-02-18 NOTE — Anesthesia Postprocedure Evaluation (Signed)
Anesthesia Post Note  Patient: Antonio [name]  Procedure(s) Performed: SCROTAL EXPLORATION WITH RIGHT TESTICULAR REPAIR (Right)     Patient location during evaluation: PACU Anesthesia Type: General Level of consciousness: awake and alert Pain management: pain level controlled Vital Signs Assessment: post-procedure vital signs reviewed and stable Respiratory status: spontaneous breathing, nonlabored ventilation, respiratory function stable and patient connected to nasal cannula oxygen Cardiovascular status: blood pressure returned to baseline and stable Postop Assessment: no apparent nausea or vomiting Anesthetic complications: no   No notable events documented.  Last Vitals:  Vitals:   02/16/23 1715 02/16/23 1745  BP: 99/72 112/66  Pulse: (!) 102 (!) 101  Resp: 16 19  Temp:  36.7 C  SpO2: 92% 94%    Last Pain:  Vitals:   02/16/23 1745  TempSrc:   PainSc: 0-No pain                 Earl Lites P [place] Derocher

## 2023-03-05 ENCOUNTER — Other Ambulatory Visit: Payer: Self-pay | Admitting: Adult Health

## 2023-03-05 DIAGNOSIS — F411 Generalized anxiety disorder: Secondary | ICD-10-CM

## 2023-03-05 DIAGNOSIS — G4700 Insomnia, unspecified: Secondary | ICD-10-CM

## 2023-03-05 DIAGNOSIS — F331 Major depressive disorder, recurrent, moderate: Secondary | ICD-10-CM

## 2023-03-05 DIAGNOSIS — F909 Attention-deficit hyperactivity disorder, unspecified type: Secondary | ICD-10-CM

## 2023-03-05 NOTE — Telephone Encounter (Signed)
Please call to schedule an appt  

## 2023-03-21 ENCOUNTER — Other Ambulatory Visit: Payer: Self-pay | Admitting: Adult Health

## 2023-03-21 DIAGNOSIS — F331 Major depressive disorder, recurrent, moderate: Secondary | ICD-10-CM

## 2023-03-21 DIAGNOSIS — F411 Generalized anxiety disorder: Secondary | ICD-10-CM

## 2023-03-22 ENCOUNTER — Other Ambulatory Visit: Payer: Self-pay

## 2023-03-22 ENCOUNTER — Telehealth: Payer: Self-pay | Admitting: Adult Health

## 2023-03-22 ENCOUNTER — Other Ambulatory Visit: Payer: Self-pay | Admitting: Adult Health

## 2023-03-22 DIAGNOSIS — G4700 Insomnia, unspecified: Secondary | ICD-10-CM

## 2023-03-22 DIAGNOSIS — F331 Major depressive disorder, recurrent, moderate: Secondary | ICD-10-CM

## 2023-03-22 DIAGNOSIS — F909 Attention-deficit hyperactivity disorder, unspecified type: Secondary | ICD-10-CM

## 2023-03-22 MED ORDER — BUPROPION HCL ER (XL) 150 MG PO TB24
450.0000 mg | ORAL_TABLET | Freq: Every morning | ORAL | 0 refills | Status: DC
Start: 2023-03-22 — End: 2023-11-29

## 2023-03-22 MED ORDER — TRAZODONE HCL 50 MG PO TABS
ORAL_TABLET | ORAL | 0 refills | Status: DC
Start: 2023-03-22 — End: 2023-06-21

## 2023-03-22 NOTE — Telephone Encounter (Signed)
Unable to lvm for pt to call back.

## 2023-03-22 NOTE — Telephone Encounter (Signed)
Sent!

## 2023-03-22 NOTE — Telephone Encounter (Signed)
Sadik made appt for 9/3. Please refill medication

## 2023-03-22 NOTE — Telephone Encounter (Signed)
Please call to schedule an appt, was a no show last one  

## 2023-03-29 ENCOUNTER — Emergency Department (HOSPITAL_COMMUNITY): Payer: No Typology Code available for payment source

## 2023-03-29 ENCOUNTER — Other Ambulatory Visit: Payer: Self-pay

## 2023-03-29 ENCOUNTER — Inpatient Hospital Stay (HOSPITAL_COMMUNITY): Payer: No Typology Code available for payment source

## 2023-03-29 ENCOUNTER — Inpatient Hospital Stay (HOSPITAL_COMMUNITY)
Admission: EM | Admit: 2023-03-29 | Discharge: 2023-04-03 | DRG: 200 | Disposition: A | Discharge status: Home / Self Care | Payer: No Typology Code available for payment source | Attending: General Surgery | Admitting: General Surgery

## 2023-03-29 ENCOUNTER — Encounter (HOSPITAL_COMMUNITY): Payer: Self-pay

## 2023-03-29 DIAGNOSIS — J939 Pneumothorax, unspecified: Secondary | ICD-10-CM

## 2023-03-29 DIAGNOSIS — S73004A Unspecified dislocation of right hip, initial encounter: Secondary | ICD-10-CM

## 2023-03-29 DIAGNOSIS — S01511A Laceration without foreign body of lip, initial encounter: Secondary | ICD-10-CM

## 2023-03-29 DIAGNOSIS — F419 Anxiety disorder, unspecified: Secondary | ICD-10-CM | POA: Diagnosis present

## 2023-03-29 DIAGNOSIS — S73014A Posterior dislocation of right hip, initial encounter: Secondary | ICD-10-CM | POA: Diagnosis present

## 2023-03-29 DIAGNOSIS — K219 Gastro-esophageal reflux disease without esophagitis: Secondary | ICD-10-CM | POA: Diagnosis present

## 2023-03-29 DIAGNOSIS — J9382 Other air leak: Secondary | ICD-10-CM | POA: Diagnosis present

## 2023-03-29 DIAGNOSIS — S270XXA Traumatic pneumothorax, initial encounter: Secondary | ICD-10-CM | POA: Diagnosis present

## 2023-03-29 DIAGNOSIS — Z79899 Other long term (current) drug therapy: Secondary | ICD-10-CM | POA: Diagnosis not present

## 2023-03-29 DIAGNOSIS — M25561 Pain in right knee: Secondary | ICD-10-CM | POA: Diagnosis present

## 2023-03-29 DIAGNOSIS — F32A Depression, unspecified: Secondary | ICD-10-CM | POA: Diagnosis present

## 2023-03-29 DIAGNOSIS — Y92410 Unspecified street and highway as the place of occurrence of the external cause: Secondary | ICD-10-CM | POA: Diagnosis not present

## 2023-03-29 DIAGNOSIS — F10129 Alcohol abuse with intoxication, unspecified: Secondary | ICD-10-CM | POA: Diagnosis present

## 2023-03-29 DIAGNOSIS — R0789 Other chest pain: Secondary | ICD-10-CM | POA: Diagnosis present

## 2023-03-29 LAB — BASIC METABOLIC PANEL
Anion gap: 16 — ABNORMAL HIGH (ref 5–15)
BUN: 9 mg/dL (ref 6–20)
CO2: 22 mmol/L (ref 22–32)
Calcium: 8.8 mg/dL — ABNORMAL LOW (ref 8.9–10.3)
Chloride: 103 mmol/L (ref 98–111)
Creatinine, Ser: 0.99 mg/dL (ref 0.61–1.24)
GFR, Estimated: >60 mL/min (ref >60)
Glucose, Bld: 87 mg/dL (ref 70–99)
Potassium: 4.1 mmol/L (ref 3.5–5.1)
Sodium: 141 mmol/L (ref 135–145)

## 2023-03-29 LAB — COMPREHENSIVE METABOLIC PANEL
ALT: 24 U/L (ref 0–44)
AST: 38 U/L (ref 15–41)
Albumin: 4.5 g/dL (ref 3.5–5.0)
Alkaline Phosphatase: 84 U/L (ref 38–126)
Anion gap: 19 — ABNORMAL HIGH (ref 5–15)
BUN: 8 mg/dL (ref 6–20)
CO2: 22 mmol/L (ref 22–32)
Calcium: 9.1 mg/dL (ref 8.9–10.3)
Chloride: 103 mmol/L (ref 98–111)
Creatinine, Ser: 1 mg/dL (ref 0.61–1.24)
GFR, Estimated: >60 mL/min (ref >60)
Glucose, Bld: 98 mg/dL (ref 70–99)
Potassium: 3.7 mmol/L (ref 3.5–5.1)
Sodium: 144 mmol/L (ref 135–145)
Total Bilirubin: 0.9 mg/dL (ref 0.3–1.2)
Total Protein: 7.2 g/dL (ref 6.5–8.1)

## 2023-03-29 LAB — CBC
HCT: 43.5 % (ref 39.0–52.0)
HCT: 40.8 % (ref 39.0–52.0)
Hemoglobin: 14.7 g/dL (ref 13.0–17.0)
Hemoglobin: 13.8 g/dL (ref 13.0–17.0)
MCH: 29.3 pg (ref 26.0–34.0)
MCH: 29.5 pg (ref 26.0–34.0)
MCHC: 33.8 g/dL (ref 30.0–36.0)
MCHC: 33.8 g/dL (ref 30.0–36.0)
MCV: 86.8 fL (ref 80.0–100.0)
MCV: 87.2 fL (ref 80.0–100.0)
Platelets: 391 10*3/uL (ref 150–400)
Platelets: 343 10*3/uL (ref 150–400)
RBC: 5.01 MIL/uL (ref 4.22–5.81)
RBC: 4.68 MIL/uL (ref 4.22–5.81)
RDW: 11.6 % (ref 11.5–15.5)
RDW: 11.8 % (ref 11.5–15.5)
WBC: 7.7 10*3/uL (ref 4.0–10.5)
WBC: 16 10*3/uL — ABNORMAL HIGH (ref 4.0–10.5)
nRBC: 0 % (ref 0.0–0.2)
nRBC: 0 % (ref 0.0–0.2)

## 2023-03-29 LAB — I-STAT CHEM 8, ED
BUN: 9 mg/dL (ref 6–20)
Calcium, Ion: 1.08 mmol/L — ABNORMAL LOW (ref 1.15–1.40)
Chloride: 104 mmol/L (ref 98–111)
Creatinine, Ser: 1.2 mg/dL (ref 0.61–1.24)
Glucose, Bld: 101 mg/dL — ABNORMAL HIGH (ref 70–99)
HCT: 43 % (ref 39.0–52.0)
Hemoglobin: 14.6 g/dL (ref 13.0–17.0)
Potassium: 3.7 mmol/L (ref 3.5–5.1)
Sodium: 144 mmol/L (ref 135–145)
TCO2: 23 mmol/L (ref 22–32)

## 2023-03-29 LAB — SAMPLE TO BLOOD BANK

## 2023-03-29 LAB — PROTIME-INR
INR: 1 (ref 0.8–1.2)
Prothrombin Time: 13.6 s (ref 11.4–15.2)

## 2023-03-29 LAB — I-STAT CG4 LACTIC ACID, ED: Lactic Acid, Venous: 2.9 mmol/L (ref 0.5–1.9)

## 2023-03-29 LAB — ETHANOL: Alcohol, Ethyl (B): 239 mg/dL — ABNORMAL HIGH

## 2023-03-29 LAB — HIV ANTIBODY (ROUTINE TESTING W REFLEX): HIV Screen 4th Generation wRfx: NONREACTIVE

## 2023-03-29 MED ORDER — LACTATED RINGERS IV SOLN: INTRAVENOUS | Status: DC

## 2023-03-29 MED ORDER — THIAMINE HCL 100 MG/ML IJ SOLN: 100.0000 mg | Freq: Every day | INTRAMUSCULAR | Status: DC

## 2023-03-29 MED ORDER — IOHEXOL 350 MG/ML SOLN
75.0000 mL | Freq: Once | INTRAVENOUS | Status: AC | PRN
  Administered 2023-03-29: 75 mL via INTRAVENOUS

## 2023-03-29 MED ORDER — LORAZEPAM 1 MG PO TABS
1.0000 mg | ORAL_TABLET | ORAL | Status: AC | PRN
  Administered 2023-03-29: 1 mg via ORAL
  Filled 2023-03-29: qty 1

## 2023-03-29 MED ORDER — LORAZEPAM 2 MG/ML IJ SOLN: 1.0000 mg | INTRAMUSCULAR | Status: AC | PRN

## 2023-03-29 MED ORDER — KETAMINE HCL 50 MG/5ML IJ SOSY
50.0000 mg | PREFILLED_SYRINGE | Freq: Once | INTRAMUSCULAR | Status: DC
  Filled 2023-03-29 (×2): qty 5

## 2023-03-29 MED ORDER — PROPOFOL 10 MG/ML IV BOLUS
100.0000 mg | Freq: Once | INTRAVENOUS | Status: DC
  Filled 2023-03-29 (×2): qty 20

## 2023-03-29 MED ORDER — HYDRALAZINE HCL 20 MG/ML IJ SOLN: 10.0000 mg | INTRAMUSCULAR | Status: DC | PRN

## 2023-03-29 MED ORDER — OXYCODONE HCL 5 MG PO TABS
5.0000 mg | ORAL_TABLET | ORAL | Status: DC | PRN
  Administered 2023-03-30: 5 mg via ORAL
  Filled 2023-03-29: qty 1

## 2023-03-29 MED ORDER — ENOXAPARIN SODIUM 30 MG/0.3ML IJ SOSY
30.0000 mg | PREFILLED_SYRINGE | Freq: Two times a day (BID) | INTRAMUSCULAR | Status: DC
  Administered 2023-03-30 – 2023-04-03 (×9): 30 mg via SUBCUTANEOUS
  Filled 2023-03-29 (×9): qty 0.3

## 2023-03-29 MED ORDER — DOCUSATE SODIUM 100 MG PO CAPS
100.0000 mg | ORAL_CAPSULE | Freq: Two times a day (BID) | ORAL | Status: DC
  Administered 2023-03-29 – 2023-04-03 (×11): 100 mg via ORAL
  Filled 2023-03-29 (×11): qty 1

## 2023-03-29 MED ORDER — ONDANSETRON HCL 4 MG/2ML IJ SOLN
4.0000 mg | Freq: Four times a day (QID) | INTRAMUSCULAR | Status: DC | PRN
  Filled 2023-03-29: qty 2

## 2023-03-29 MED ORDER — KETAMINE HCL 10 MG/ML IJ SOLN
INTRAMUSCULAR | Status: AC | PRN
Start: 2023-03-29 — End: 2023-03-29
  Administered 2023-03-29: 20 mg via INTRAVENOUS
  Administered 2023-03-29: 30 mg via INTRAVENOUS

## 2023-03-29 MED ORDER — KETAMINE HCL 50 MG/5ML IJ SOSY
PREFILLED_SYRINGE | INTRAMUSCULAR | Status: AC
  Filled 2023-03-29: qty 5

## 2023-03-29 MED ORDER — ACETAMINOPHEN 500 MG PO TABS
1000.0000 mg | ORAL_TABLET | Freq: Four times a day (QID) | ORAL | Status: DC
  Administered 2023-03-29 – 2023-04-03 (×19): 1000 mg via ORAL
  Filled 2023-03-29 (×19): qty 2

## 2023-03-29 MED ORDER — FOLIC ACID 1 MG PO TABS
1.0000 mg | ORAL_TABLET | Freq: Every day | ORAL | Status: DC
  Administered 2023-03-29 – 2023-04-03 (×6): 1 mg via ORAL
  Filled 2023-03-29 (×6): qty 1

## 2023-03-29 MED ORDER — SODIUM CHLORIDE 0.9 % IV BOLUS
1000.0000 mL | Freq: Once | INTRAVENOUS | Status: AC
  Administered 2023-03-29: 1000 mL via INTRAVENOUS

## 2023-03-29 MED ORDER — THIAMINE MONONITRATE 100 MG PO TABS
100.0000 mg | ORAL_TABLET | Freq: Every day | ORAL | Status: DC
  Administered 2023-03-29 – 2023-04-03 (×6): 100 mg via ORAL
  Filled 2023-03-29 (×6): qty 1

## 2023-03-29 MED ORDER — PROPOFOL 10 MG/ML IV BOLUS
100.0000 mg | Freq: Once | INTRAVENOUS | Status: DC
  Filled 2023-03-29: qty 20

## 2023-03-29 MED ORDER — POLYETHYLENE GLYCOL 3350 17 G PO PACK: 17.0000 g | PACK | Freq: Every day | ORAL | Status: DC | PRN

## 2023-03-29 MED ORDER — METOPROLOL TARTRATE 5 MG/5ML IV SOLN: 5.0000 mg | Freq: Four times a day (QID) | INTRAVENOUS | Status: DC | PRN

## 2023-03-29 MED ORDER — TRAMADOL HCL 50 MG PO TABS
50.0000 mg | ORAL_TABLET | Freq: Four times a day (QID) | ORAL | Status: DC | PRN
  Administered 2023-03-29 – 2023-03-31 (×4): 50 mg via ORAL
  Filled 2023-03-29 (×4): qty 1

## 2023-03-29 MED ORDER — ADULT MULTIVITAMIN W/MINERALS CH
1.0000 | ORAL_TABLET | Freq: Every day | ORAL | Status: DC
  Administered 2023-03-29 – 2023-04-03 (×6): 1 via ORAL
  Filled 2023-03-29 (×6): qty 1

## 2023-03-29 MED ORDER — FENTANYL CITRATE PF 50 MCG/ML IJ SOSY
25.0000 ug | PREFILLED_SYRINGE | Freq: Once | INTRAMUSCULAR | Status: AC
  Administered 2023-03-29: 25 ug via INTRAVENOUS
  Filled 2023-03-29: qty 1

## 2023-03-29 MED ORDER — KETAMINE HCL 50 MG/5ML IJ SOSY: 50.0000 mg | PREFILLED_SYRINGE | Freq: Once | INTRAMUSCULAR | Status: DC

## 2023-03-29 MED ORDER — IBUPROFEN 200 MG PO TABS
600.0000 mg | ORAL_TABLET | Freq: Four times a day (QID) | ORAL | Status: DC | PRN
  Administered 2023-03-29: 600 mg via ORAL
  Filled 2023-03-29: qty 1

## 2023-03-29 MED ORDER — SODIUM CHLORIDE 0.9 % IV SOLN: INTRAVENOUS | Status: DC

## 2023-03-29 MED ORDER — PROPOFOL 10 MG/ML IV BOLUS
INTRAVENOUS | Status: AC | PRN
  Administered 2023-03-29 (×2): 50 mg via INTRAVENOUS

## 2023-03-29 MED ORDER — ONDANSETRON 4 MG PO TBDP: 4.0000 mg | ORAL_TABLET | Freq: Four times a day (QID) | ORAL | Status: DC | PRN

## 2023-03-29 MED ORDER — HYDROMORPHONE HCL 1 MG/ML IJ SOLN
0.5000 mg | INTRAMUSCULAR | Status: DC | PRN
  Administered 2023-03-29 (×2): 0.5 mg via INTRAVENOUS
  Filled 2023-03-29 (×2): qty 1
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
## 2023-03-29 NOTE — ED Provider Notes (Signed)
Reduction of dislocation  Date/Time: 03/29/2023 5:12 AM  Performed by: Netta Corrigan, PA-C Authorized by: Netta Corrigan, PA-C  Consent: The procedure was performed in an emergent situation. Verbal consent obtained. Risks and benefits: risks, benefits and alternatives were discussed Patient understanding: patient states understanding of the procedure being performed Imaging studies: imaging studies available Patient identity confirmed: verbally with patient Local anesthesia used: no  Anesthesia: Local anesthesia used: no  Sedation: Patient sedated: yes Sedation type: moderate (conscious) sedation Sedatives: propofol Analgesia: ketamine Vitals: Vital signs were monitored during sedation.  Patient tolerance: patient tolerated the procedure well with no immediate complications       Antonio [name] 03/29/23 0514    Nira Conn, MD 03/30/23 [phone]

## 2023-03-29 NOTE — ED Notes (Signed)
Patient is resting comfortably. 

## 2023-03-29 NOTE — ED Notes (Signed)
Pts wife Candace notified and updated on pts status.

## 2023-03-29 NOTE — ED Notes (Signed)
Shift report received from [facility]. Assumed care of patient at this time.
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
## 2023-03-29 NOTE — Progress Notes (Signed)
Orthopedic Tech Progress Note Patient Details:  Antonio [name] [birth year] [id number]  Patient ID: Antonio [name], male   DOB: [birth year], 37 y.o.   MRN: [id number] Level II; not currently needed. Darleen Crocker 03/29/2023, 2:39 AM

## 2023-03-29 NOTE — Progress Notes (Signed)
Trauma Event Note    Pt complaining of right knee pain during TRN rounds this evening. Noted knee immobilizer in place on that leg. Removed for assessment, no redness, deformity/trauma noted, no pressure injury noted. CMS intact. Dr. Janee Morn notified for orders for XRAY.   Last imported Vital Signs BP 132/85   Pulse 80   Temp 98.6 F (37 C) (Oral)   Resp 20   Ht 5\' 7"  (1.702 m)   Wt 145 lb (65.8 kg)   SpO2 96%   BMI 22.71 kg/m   Trending CBC Recent Labs    03/29/23 0208 03/29/23 0214 03/29/23 0620  WBC 7.7  --  16.0*  HGB 14.7 14.6 13.8  HCT 43.5 43.0 40.8  PLT 391  --  343    Trending Coag's Recent Labs    03/29/23 0208  INR 1.0    Trending BMET Recent Labs    03/29/23 0208 03/29/23 0214 03/29/23 0620  NA 144 144 141  K 3.7 3.7 4.1  CL 103 104 103  CO2 22  --  22  BUN 8 9 9   CREATININE 1.00 1.20 0.99  GLUCOSE 98 101* 87      Antonio [name] O Allin Frix  Trauma Response RN  Please call TRN at [phone] for further assistance.
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
## 2023-03-29 NOTE — ED Notes (Signed)
Lip laceration repaired at this time by Dr. Eudelia Bunch

## 2023-03-29 NOTE — TOC CAGE-AID Note (Signed)
Transition of Care [facility]) - CAGE-AID Screening   Patient Details  Name: Antonio [name] MRN: [id number] Date of Birth: 1986-06-17  [facility] CM/SW Contact:    Katha Hamming, RN Phone Number: 03/29/2023, 9:23 PM   Clinical Narrative:  Pt reports he drinks 5 beers a day (Ethanol 239 on arrival) and states he does not typically get withdrawal symptoms when he does not drink. Reports no drug use. Declines resources.   CAGE-AID Screening:    Have You Ever Felt You Ought to Cut Down on Your Drinking or Drug Use?: No Have People Annoyed You By Critizing Your Drinking Or Drug Use?: No Have You Felt Bad Or Guilty About Your Drinking Or Drug Use?: No Have You Ever Had a Drink or Used Drugs First Thing In The Morning to Steady Your Nerves or to Get Rid of a Hangover?: No CAGE-AID Score: 0  Substance Abuse Education Offered: No (reports he drinks 5 beers a day, denies drug use, declines resources)
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
## 2023-03-30 ENCOUNTER — Inpatient Hospital Stay (HOSPITAL_COMMUNITY): Payer: No Typology Code available for payment source

## 2023-03-30 ENCOUNTER — Encounter (HOSPITAL_COMMUNITY): Payer: Self-pay

## 2023-03-30 MED ORDER — OXYCODONE HCL 5 MG PO TABS
5.0000 mg | ORAL_TABLET | ORAL | Status: DC | PRN
  Administered 2023-03-30 – 2023-04-03 (×9): 10 mg via ORAL
  Filled 2023-03-30 (×9): qty 2

## 2023-03-30 MED ORDER — HYDROMORPHONE HCL 1 MG/ML IJ SOLN
0.5000 mg | INTRAMUSCULAR | Status: DC | PRN
  Administered 2023-03-31: 0.5 mg via INTRAVENOUS
  Filled 2023-03-30: qty 0.5

## 2023-03-30 MED ORDER — TAMSULOSIN HCL 0.4 MG PO CAPS
0.4000 mg | ORAL_CAPSULE | Freq: Every day | ORAL | Status: DC
  Administered 2023-03-30 – 2023-04-03 (×5): 0.4 mg via ORAL
  Filled 2023-03-30 (×5): qty 1

## 2023-03-30 MED ORDER — METHOCARBAMOL 500 MG PO TABS
1000.0000 mg | ORAL_TABLET | Freq: Three times a day (TID) | ORAL | Status: DC
  Administered 2023-03-30 – 2023-04-03 (×12): 1000 mg via ORAL
  Filled 2023-03-30 (×12): qty 2

## 2023-03-30 MED ORDER — POLYETHYLENE GLYCOL 3350 17 G PO PACK
17.0000 g | PACK | Freq: Every day | ORAL | Status: DC
  Administered 2023-03-30 – 2023-04-03 (×5): 17 g via ORAL
  Filled 2023-03-30 (×6): qty 1
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
## 2023-03-30 NOTE — Plan of Care (Signed)
  Problem: Education: Goal: Knowledge of General Education information will improve Description: Including pain rating scale, medication(s)/side effects and non-pharmacologic comfort measures Outcome: Progressing   Problem: Activity: Goal: Risk for activity intolerance will decrease Outcome: Progressing   
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
## 2023-03-31 ENCOUNTER — Inpatient Hospital Stay (HOSPITAL_COMMUNITY): Payer: No Typology Code available for payment source

## 2023-03-31 LAB — MRSA NEXT GEN BY PCR, NASAL: MRSA by PCR Next Gen: NOT DETECTED

## 2023-03-31 NOTE — Progress Notes (Signed)
At 1147 pt noted severe pain to his right knee. RN administered oral pain medication first and then IV after pt reported increasing pain. RN opened knee immobilizer and found a serous filled blister to pt's right knee anterior. ABD pad was placed to prevent further friction and knee immobilizer was put back on. Pt states he is feeling more comfortable. He is the bed placed in the lowest position, call bell and table within reach.
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
## 2023-04-01 ENCOUNTER — Inpatient Hospital Stay (HOSPITAL_COMMUNITY): Payer: Non-veteran care

## 2023-04-01 ENCOUNTER — Inpatient Hospital Stay (HOSPITAL_COMMUNITY): Payer: No Typology Code available for payment source

## 2023-04-01 MED ORDER — NAPHAZOLINE-GLYCERIN 0.012-0.25 % OP SOLN
1.0000 [drp] | Freq: Four times a day (QID) | OPHTHALMIC | Status: DC | PRN
Start: 2023-04-01 — End: 2023-04-01
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
## 2023-04-01 NOTE — Plan of Care (Signed)

## 2023-04-02 ENCOUNTER — Inpatient Hospital Stay (HOSPITAL_COMMUNITY): Payer: No Typology Code available for payment source

## 2023-04-02 MED ORDER — OXYCODONE HCL 10 MG PO TABS: 5.0000 mg | ORAL_TABLET | Freq: Four times a day (QID) | ORAL | 0 refills | Status: AC | PRN

## 2023-04-02 MED ORDER — METHOCARBAMOL 500 MG PO TABS: 500.0000 mg | ORAL_TABLET | Freq: Four times a day (QID) | ORAL | 0 refills | Status: AC | PRN

## 2023-04-02 MED ORDER — ADULT MULTIVITAMIN W/MINERALS CH: 1.0000 | ORAL_TABLET | Freq: Every day | ORAL | Status: AC

## 2023-04-02 MED ORDER — IBUPROFEN 200 MG PO TABS: 200.0000 mg | ORAL_TABLET | Freq: Four times a day (QID) | ORAL | Status: AC | PRN

## 2023-04-02 MED ORDER — ACETAMINOPHEN 500 MG PO TABS: 1000.0000 mg | ORAL_TABLET | Freq: Four times a day (QID) | ORAL | Status: AC | PRN

## 2023-04-02 MED ORDER — DOCUSATE SODIUM 100 MG PO CAPS: 100.0000 mg | ORAL_CAPSULE | Freq: Two times a day (BID) | ORAL | Status: AC

## 2023-04-02 MED ORDER — POLYETHYLENE GLYCOL 3350 17 G PO PACK: 17.0000 g | PACK | Freq: Every day | ORAL | Status: AC | PRN

## 2023-04-02 NOTE — Plan of Care (Signed)
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
## 2023-04-03 ENCOUNTER — Ambulatory Visit: Payer: 59 | Admitting: Adult Health

## 2023-04-03 ENCOUNTER — Encounter (HOSPITAL_COMMUNITY): Payer: Self-pay | Admitting: Urology

## 2023-04-03 NOTE — Progress Notes (Signed)
Pt with orders to d/c home. PIVs removed. Assessment documented pt is stable. Prescriptions sent to pharmacy pt is aware. Discharge education and packet provided all questions answered. Pt and all belongings transported via wheelchair to private vehicle.
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
## 2023-04-03 NOTE — TOC Transition Note (Signed)
Transition of Care [facility]) - CM/SW Discharge Note   Patient Details  Name: Antonio [name] MRN: [id number] Date of Birth: 03-Sep-1985  [facility] CM/SW Contact:  Glennon Mac, RN Phone Number: 04/03/2023, 12:11 PM   Clinical Narrative:    Patient medically stable for discharge home with significant other to provide needed assistance.  Patient has VA benefits; patient made aware that processing of equipment with VA can take up to three days, and then has to be shipped to patient. He states he has access to a RW and declines BSC.  Patient agreeable to referral for OP PT/OT; he prefers [facility] in [place].   Referral made accordingly.    Final next level of care: OP Rehab Barriers to Discharge: Barriers Resolved                              Discharge Plan and Services Additional resources added to the After Visit Summary for     Discharge Planning Services: CM Consult            DME Arranged: Walker rolling                    Social Determinants of Health (SDOH) Interventions SDOH Screenings   Food Insecurity: No Food Insecurity (03/30/2023)  Housing: Low Risk  (03/30/2023)  Transportation Needs: No Transportation Needs (03/30/2023)  Utilities: Not At Risk (03/30/2023)     Readmission Risk Interventions     No data to display         Quintella Baton, RN, BSN  Trauma/Neuro ICU Case Manager [phone]

## 2023-04-11 ENCOUNTER — Ambulatory Visit: Payer: 59 | Attending: Physician Assistant

## 2023-04-11 ENCOUNTER — Other Ambulatory Visit: Payer: Self-pay

## 2023-04-11 DIAGNOSIS — M25651 Stiffness of right hip, not elsewhere classified: Secondary | ICD-10-CM | POA: Insufficient documentation

## 2023-04-11 DIAGNOSIS — M25551 Pain in right hip: Secondary | ICD-10-CM | POA: Diagnosis present
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
## 2023-04-17 ENCOUNTER — Ambulatory Visit: Payer: 59

## 2023-04-17 DIAGNOSIS — M25551 Pain in right hip: Secondary | ICD-10-CM

## 2023-04-17 DIAGNOSIS — M25651 Stiffness of right hip, not elsewhere classified: Secondary | ICD-10-CM
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
## 2023-04-19 ENCOUNTER — Ambulatory Visit: Payer: 59

## 2023-04-19 DIAGNOSIS — M25551 Pain in right hip: Secondary | ICD-10-CM

## 2023-04-19 DIAGNOSIS — M25651 Stiffness of right hip, not elsewhere classified: Secondary | ICD-10-CM
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
## 2023-04-24 ENCOUNTER — Ambulatory Visit: Payer: 59 | Admitting: *Deleted

## 2023-04-24 DIAGNOSIS — M25551 Pain in right hip: Secondary | ICD-10-CM | POA: Diagnosis not present

## 2023-04-24 DIAGNOSIS — M25651 Stiffness of right hip, not elsewhere classified: Secondary | ICD-10-CM
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
## 2023-04-25 ENCOUNTER — Ambulatory Visit (HOSPITAL_BASED_OUTPATIENT_CLINIC_OR_DEPARTMENT_OTHER)
Admission: RE | Admit: 2023-04-25 | Discharge: 2023-04-25 | Disposition: A | Discharge status: Home / Self Care | Payer: 59 | Source: Ambulatory Visit | Attending: Physician Assistant | Admitting: Physician Assistant

## 2023-04-25 DIAGNOSIS — J939 Pneumothorax, unspecified: Secondary | ICD-10-CM | POA: Diagnosis present

## 2023-04-25 DIAGNOSIS — S2231XA Fracture of one rib, right side, initial encounter for closed fracture: Secondary | ICD-10-CM | POA: Diagnosis not present

## 2023-04-26 ENCOUNTER — Ambulatory Visit: Payer: 59 | Admitting: *Deleted

## 2023-04-26 ENCOUNTER — Encounter: Payer: Self-pay | Admitting: *Deleted

## 2023-04-26 DIAGNOSIS — M25551 Pain in right hip: Secondary | ICD-10-CM | POA: Diagnosis not present

## 2023-04-26 DIAGNOSIS — M25651 Stiffness of right hip, not elsewhere classified: Secondary | ICD-10-CM
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
## 2023-05-01 ENCOUNTER — Ambulatory Visit: Payer: 59 | Attending: Physician Assistant

## 2023-05-01 DIAGNOSIS — M25551 Pain in right hip: Secondary | ICD-10-CM | POA: Insufficient documentation

## 2023-05-01 DIAGNOSIS — M25651 Stiffness of right hip, not elsewhere classified: Secondary | ICD-10-CM | POA: Insufficient documentation

## 2023-05-03 ENCOUNTER — Encounter: Payer: Self-pay | Admitting: *Deleted

## 2023-05-03 ENCOUNTER — Ambulatory Visit: Payer: 59 | Admitting: *Deleted

## 2023-05-03 DIAGNOSIS — M25551 Pain in right hip: Secondary | ICD-10-CM | POA: Diagnosis present

## 2023-05-03 DIAGNOSIS — M25651 Stiffness of right hip, not elsewhere classified: Secondary | ICD-10-CM
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
## 2023-05-10 ENCOUNTER — Encounter: Admitting: *Deleted

## 2023-06-21 ENCOUNTER — Encounter: Payer: Self-pay | Admitting: Adult Health

## 2023-06-21 ENCOUNTER — Ambulatory Visit (INDEPENDENT_AMBULATORY_CARE_PROVIDER_SITE_OTHER): Admitting: Adult Health

## 2023-06-21 DIAGNOSIS — F331 Major depressive disorder, recurrent, moderate: Secondary | ICD-10-CM | POA: Diagnosis not present

## 2023-06-21 DIAGNOSIS — F909 Attention-deficit hyperactivity disorder, unspecified type: Secondary | ICD-10-CM | POA: Diagnosis not present

## 2023-06-21 DIAGNOSIS — F411 Generalized anxiety disorder: Secondary | ICD-10-CM

## 2023-06-21 DIAGNOSIS — G4700 Insomnia, unspecified: Secondary | ICD-10-CM | POA: Diagnosis not present

## 2023-06-21 MED ORDER — BUPROPION HCL ER (XL) 150 MG PO TB24: 450.0000 mg | ORAL_TABLET | Freq: Every day | ORAL | 5 refills | Status: DC

## 2023-06-21 MED ORDER — TRAZODONE HCL 50 MG PO TABS
ORAL_TABLET | ORAL | 5 refills | Status: DC
Start: 2023-06-21 — End: 2023-11-29

## 2023-06-21 MED ORDER — ESCITALOPRAM OXALATE 20 MG PO TABS
ORAL_TABLET | ORAL | 1 refills | Status: DC
Start: 2023-06-21 — End: 2023-11-29

## 2023-06-21 MED ORDER — AMPHETAMINE-DEXTROAMPHET ER 20 MG PO CP24
20.0000 mg | ORAL_CAPSULE | Freq: Two times a day (BID) | ORAL | 0 refills | Status: DC
Start: 2023-06-21 — End: 2023-08-13

## 2023-06-21 NOTE — Progress Notes (Signed)
Antonio [name] [id number] [birth year] 37 y.o.  Subjective:   Patient ID:  Antonio [name] is a 37 y.o. (DOB 04-Nov-1985) adult.  Chief Complaint: No chief complaint on file.   HPI Antonio [name] presents to the office today for follow-up of anxiety, depression, insomnia, ADHD and PTSD.  Describes mood today as "ok". Pleasant. Denies tearfukness. Mood symptoms - reports some depression, anxiety and irritability. Denies panic attacks. Denies worry, rumination, and over thinking. Mood is consistent. Stating "I feel like I'm doing ok". Feels like current medication regimen is helpful - had to stop taking it for the past month and a half while on active duty - [employer]. Working full time English as a second language teacher. He and wife doing well. Varying interest and motivation. Taking medications as prescribed.  Energy levels vary. Active, does not have a regular exercise routine.  Enjoys some usual interests and activities. Married. Lives with wife and dog - "[name]". Family local.  Appetite stable. Weight stable - 140 to 150 pounds. Sleeps well most nights. Averages 6 hours when working and more when off. Focus and concentration stable - using Adderall as needed. Completing tasks. Managing aspects of household. 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  Denies SI or HI.  Denies AH or VH. Denies self harm. Denies substance use.   CAGE-AID    Flowsheet Row ED to Hosp-Admission (Discharged) from 03/29/2023 in [place] [facility] ED to Hosp-Admission (Discharged) from 05/19/2022 in [place] [facility]  CAGE-AID Score 0 3      PHQ2-9    Flowsheet Row Nutrition from 09/01/2020 in [facility]  - A Dept Of [place] Office Visit from 12/24/2013 in [facility] Office Visit from 10/09/2013 in [facility]  PHQ-2 Total Score 0 0 0      Flowsheet Row ED to Hosp-Admission  (Discharged) from 03/29/2023 in [place] [facility] ED to Hosp-Admission (Discharged) from 02/16/2023 in [place] [facility] ED to Hosp-Admission (Discharged) from 05/19/2022 in [place] [facility]  C-SSRS RISK CATEGORY No Risk No Risk No Risk        Review of Systems:  Review of Systems  Musculoskeletal:  Negative for gait problem.  Neurological:  Negative for tremors.  Psychiatric/Behavioral:         Please refer to HPI    Medications: I have reviewed the patient's current medications.  Current Outpatient Medications  Medication Sig Dispense Refill   acetaminophen (TYLENOL) 500 MG tablet Take 2 tablets (1,000 mg total) by mouth every 8 (eight) hours as needed. (Patient not taking: Reported on 02/16/2023) 30 tablet 0   acetaminophen (TYLENOL) 500 MG tablet Take 2 tablets (1,000 mg total) by mouth every 6 (six) hours as needed for mild pain.     amphetamine-dextroamphetamine (ADDERALL XR) 20 MG 24 hr capsule Take 20 mg by mouth 2 (two) times daily.     amphetamine-dextroamphetamine (ADDERALL XR) 20 MG 24 hr capsule Take 1 capsule (20 mg total) by mouth 2 (two) times daily. 60 capsule 0   Ascorbic Acid (VITAMIN C PO) Take 1 tablet by mouth daily.     buPROPion (WELLBUTRIN XL) 150 MG 24 hr tablet Take 3 tablets (450 mg total) by mouth every morning. 90 tablet 0   buPROPion (WELLBUTRIN XL) 150 MG 24 hr tablet Take 3 tablets (450 mg total) by mouth daily. 90 tablet  5   docusate sodium (COLACE) 100 MG capsule Take 1 capsule (100 mg total) by mouth 2 (two) times daily.     escitalopram (LEXAPRO) 20 MG tablet TAKE 1 TABLET DAILY 90 tablet 1   Ferrous Sulfate (IRON PO) Take 1 tablet by mouth daily.     folic acid (FOLVITE) 1 MG tablet Take 1 tablet (1 mg total) by mouth daily. (Patient not taking: Reported on 02/16/2023)     HYDROcodone-acetaminophen (NORCO) 7.5-325 MG tablet Take 1 tablet by mouth every 6 (six) hours as needed for moderate pain. 15 tablet  0   ibuprofen (ADVIL) 200 MG tablet Take 1 tablet (200 mg total) by mouth every 6 (six) hours as needed for moderate pain (pain not controlled with tylenol).     methocarbamol (ROBAXIN) 500 MG tablet Take 1-2 tablets (500-1,000 mg total) by mouth every 6 (six) hours as needed for muscle spasms. 30 tablet 0   Multiple Vitamin (MULTIVITAMIN WITH MINERALS) TABS tablet Take 1 tablet by mouth daily. (Patient not taking: Reported on 02/16/2023)     Multiple Vitamin (MULTIVITAMIN WITH MINERALS) TABS tablet Take 1 tablet by mouth daily.     oxyCODONE 10 MG TABS Take 0.5-1 tablets (5-10 mg total) by mouth every 6 (six) hours as needed for severe pain or moderate pain. 20 tablet 0   pantoprazole (PROTONIX) 40 MG tablet Take 1 tablet (40 mg total) by mouth daily. 30 tablet 3   pantoprazole (PROTONIX) 40 MG tablet Take 40 mg by mouth daily.     polyethylene glycol (MIRALAX / GLYCOLAX) 17 g packet Take 17 g by mouth daily as needed for mild constipation.     REXULTI 0.5 MG TABS Take 1 tablet (0.5 mg total) by mouth daily. (Patient not taking: Reported on 02/16/2023) 90 tablet 0   thiamine (VITAMIN B-1) 100 MG tablet Take 1 tablet (100 mg total) by mouth daily. (Patient not taking: Reported on 02/16/2023)     traZODone (DESYREL) 50 MG tablet Take 100 mg by mouth at bedtime as needed.     traZODone (DESYREL) 50 MG tablet Take one to two tablets at bedtime as needed. 60 tablet 5   No current facility-administered medications for this visit.    Medication Side Effects: None  Allergies: No Known Allergies  Past Medical History:  Diagnosis Date   Anxiety 11/02/2015   Chronic pain of left ankle 04/24/2018   Depression 11/02/2015   Gastroesophageal reflux disease    question underlying stricture as pt is vomiting up undigested food particles. will try to obtain the ugi asap as he cannot be scheduled until feb 22.  spoke with dr Clide Cliff who cannot add onto the schedule.  will attempt to schedule thru [facility] off  post asap  Dec 04, 2008 Entered By: Carrington Clamp R Comment: nl EGD 1/09   GERD (gastroesophageal reflux disease)    Hemangioma 01/24/2016   HLD (hyperlipidemia)    Nephrolithiasis 05/19/2022   OSA on CPAP 01/06/2016   Home sleep study: 12/22/2015 Mild Sleep Apnea  Total apnea events over 3.6 hours = 22 Hypoxemia events for an index of 7.9 hours=48 10 obstructive apneas 11 central apneas 1 mixed apnea AHI= 11.6 per hour of sleep Cheyne-Stokes respirations were not observed Hypoventilation was not observed 41 desaturations occurred during the study Lowest saturation was 91% with an average of 95% Minimum saturati   Pityriasis versicolor 02/04/2019   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   Suicidal ideation 02/04/2019   Vitamin D deficiency  Past Medical History, Surgical history, Social history, and Family history were reviewed and updated as appropriate.   Please see review of systems for further details on the patient's review from today.   Objective:   Physical Exam:  There were no vitals taken for this visit.  Physical Exam Constitutional:      General: She is not in acute distress. Musculoskeletal:        General: No deformity.  Neurological:     Mental Status: She is alert and oriented to person, place, and time.     Coordination: Coordination normal.  Psychiatric:        Attention and Perception: Attention and perception normal. She does not perceive auditory or visual hallucinations.        Mood and Affect: Mood is not anxious or depressed. Affect is not labile, blunt, angry or inappropriate.        Speech: Speech normal.        Thought Content: Thought content normal. Thought content is not paranoid or delusional. Thought content does not include homicidal or suicidal ideation. Thought content does not include homicidal or suicidal plan.        Cognition and Memory: Cognition and memory normal.        Judgment: Judgment normal.     Comments: Insight intact     Lab  Review:     Component Value Date/Time   NA 141 03/29/2023 0620   NA 142 02/16/2014 1501   K 4.1 03/29/2023 0620   CL 103 03/29/2023 0620   CO2 22 03/29/2023 0620   GLUCOSE 87 03/29/2023 0620   BUN 9 03/29/2023 0620   BUN 13 02/16/2014 1501   CREATININE 0.99 03/29/2023 0620   CALCIUM 8.8 (L) 03/29/2023 0620   PROT 7.2 03/29/2023 0208   PROT 6.6 02/16/2014 1501   ALBUMIN 4.5 03/29/2023 0208   ALBUMIN 4.9 02/16/2014 1501   AST 38 03/29/2023 0208   ALT 24 03/29/2023 0208   ALKPHOS 84 03/29/2023 0208   BILITOT 0.9 03/29/2023 0208   GFRNONAA >60 03/29/2023 0620   GFRAA >60 02/22/2019 0239       Component Value Date/Time   WBC 16.0 (H) 03/29/2023 0620   RBC 4.68 03/29/2023 0620   HGB 13.8 03/29/2023 0620   HCT 40.8 03/29/2023 0620   PLT 343 03/29/2023 0620   MCV 87.2 03/29/2023 0620   MCV 86.9 08/01/2013 1040   MCH 29.5 03/29/2023 0620   MCHC 33.8 03/29/2023 0620   RDW 11.8 03/29/2023 0620   LYMPHSABS 1.0 02/16/2023 0400   MONOABS 1.5 (H) 02/16/2023 0400   EOSABS 0.0 02/16/2023 0400   BASOSABS 0.0 02/16/2023 0400    No results found for: "POCLITH", "LITHIUM"   No results found for: "PHENYTOIN", "PHENOBARB", "VALPROATE", "CBMZ"   .res Assessment: Plan:    Plan:  Trazadone 50mg  - 1 to 2 at hs Lexapro 20mg  daily  Wellbutrin XL 450mg  - 150mg  daily x 7 days, then increase 300mg  x 7 days - then increase to 450mg  after one month. Aderall XR 20mg  BID as needed  Plans to try and start taking medications more consistently. Plans to see PCP upcoming.  Monitor BP between visits while taking stimulant medication.   RTC 6 months  Patient advised to contact office with any questions, adverse effects, or acute worsening in signs and symptoms.  Discussed potential benefits, risks, and side effects of stimulants with patient to include increased heart rate, palpitations, insomnia, increased anxiety, increased irritability, or decreased appetite.  Instructed patient to contact  office if experiencing any significant tolerability issues.  Diagnoses and all orders for this visit:  Major depressive disorder, recurrent episode, moderate (HCC) -     buPROPion (WELLBUTRIN XL) 150 MG 24 hr tablet; Take 3 tablets (450 mg total) by mouth daily. -     escitalopram (LEXAPRO) 20 MG tablet; TAKE 1 TABLET DAILY  Attention deficit hyperactivity disorder (ADHD), unspecified ADHD type -     amphetamine-dextroamphetamine (ADDERALL XR) 20 MG 24 hr capsule; Take 1 capsule (20 mg total) by mouth 2 (two) times daily.  Generalized anxiety disorder -     escitalopram (LEXAPRO) 20 MG tablet; TAKE 1 TABLET DAILY  Insomnia, unspecified type -     traZODone (DESYREL) 50 MG tablet; Take one to two tablets at bedtime as needed.     Please see After Visit Summary for patient specific instructions.  Future Appointments  Date Time Provider Department Center  09/21/2023  9:00 AM Sheba Whaling, Thereasa Solo, NP [facility] None    No orders of the defined types were placed in this encounter.   -------------------------------

## 2023-08-13 ENCOUNTER — Other Ambulatory Visit: Payer: Self-pay

## 2023-08-13 ENCOUNTER — Telehealth: Payer: Self-pay | Admitting: Adult Health

## 2023-08-13 DIAGNOSIS — F909 Attention-deficit hyperactivity disorder, unspecified type: Secondary | ICD-10-CM

## 2023-08-13 MED ORDER — AMPHETAMINE-DEXTROAMPHET ER 20 MG PO CP24
20.0000 mg | ORAL_CAPSULE | Freq: Two times a day (BID) | ORAL | 0 refills | Status: DC
Start: 2023-08-13 — End: 2023-10-17

## 2023-08-13 NOTE — Telephone Encounter (Signed)
 Pt LVM @ 12:48p requesting refill of Adderall to   [id number] - [place], [place] - [address] [place], [place] [address] Phone: [phone]  Fax: [phone]   Next appt 2/21

## 2023-08-13 NOTE — Telephone Encounter (Signed)
 Pended Adderall to [facility]

## 2023-08-23 ENCOUNTER — Other Ambulatory Visit (HOSPITAL_BASED_OUTPATIENT_CLINIC_OR_DEPARTMENT_OTHER): Payer: Self-pay

## 2023-08-23 ENCOUNTER — Encounter (HOSPITAL_BASED_OUTPATIENT_CLINIC_OR_DEPARTMENT_OTHER): Payer: Self-pay | Admitting: Emergency Medicine

## 2023-08-23 ENCOUNTER — Other Ambulatory Visit: Payer: Self-pay

## 2023-08-23 ENCOUNTER — Emergency Department (HOSPITAL_BASED_OUTPATIENT_CLINIC_OR_DEPARTMENT_OTHER)
Admission: EM | Admit: 2023-08-23 | Discharge: 2023-08-23 | Disposition: A | Discharge status: Home / Self Care | Attending: Emergency Medicine | Admitting: Emergency Medicine

## 2023-08-23 DIAGNOSIS — R112 Nausea with vomiting, unspecified: Secondary | ICD-10-CM | POA: Insufficient documentation

## 2023-08-23 DIAGNOSIS — Z20822 Contact with and (suspected) exposure to covid-19: Secondary | ICD-10-CM | POA: Diagnosis not present

## 2023-08-23 LAB — LIPASE, BLOOD: Lipase: 18 U/L (ref 11–51)

## 2023-08-23 LAB — URINALYSIS, ROUTINE W REFLEX MICROSCOPIC
Bilirubin Urine: NEGATIVE
Glucose, UA: NEGATIVE mg/dL
Hgb urine dipstick: NEGATIVE
Ketones, ur: NEGATIVE mg/dL
Leukocytes,Ua: NEGATIVE
Nitrite: NEGATIVE
Protein, ur: NEGATIVE mg/dL
Specific Gravity, Urine: 1.007 (ref 1.005–1.030)
pH: 8 (ref 5.0–8.0)

## 2023-08-23 LAB — CBC
HCT: 41.1 % (ref 39.0–52.0)
Hemoglobin: 14.3 g/dL (ref 13.0–17.0)
MCH: 29.6 pg (ref 26.0–34.0)
MCHC: 34.8 g/dL (ref 30.0–36.0)
MCV: 85.1 fL (ref 80.0–100.0)
Platelets: 270 10*3/uL (ref 150–400)
RBC: 4.83 MIL/uL (ref 4.22–5.81)
RDW: 11.7 % (ref 11.5–15.5)
WBC: 5.4 10*3/uL (ref 4.0–10.5)
nRBC: 0 % (ref 0.0–0.2)

## 2023-08-23 LAB — COMPREHENSIVE METABOLIC PANEL
ALT: 19 U/L (ref 0–44)
AST: 25 U/L (ref 15–41)
Albumin: 4.3 g/dL (ref 3.5–5.0)
Alkaline Phosphatase: 77 U/L (ref 38–126)
Anion gap: 5 (ref 5–15)
BUN: 17 mg/dL (ref 6–20)
CO2: 31 mmol/L (ref 22–32)
Calcium: 8.4 mg/dL — ABNORMAL LOW (ref 8.9–10.3)
Chloride: 100 mmol/L (ref 98–111)
Creatinine, Ser: 1.07 mg/dL (ref 0.61–1.24)
GFR, Estimated: >60 mL/min (ref >60)
Glucose, Bld: 92 mg/dL (ref 70–99)
Potassium: 3.7 mmol/L (ref 3.5–5.1)
Sodium: 136 mmol/L (ref 135–145)
Total Bilirubin: 1.3 mg/dL — ABNORMAL HIGH (ref 0.0–1.2)
Total Protein: 6.3 g/dL — ABNORMAL LOW (ref 6.5–8.1)

## 2023-08-23 LAB — RESP PANEL BY RT-PCR (RSV, FLU A&B, COVID)  RVPGX2
Influenza A by PCR: NEGATIVE
Influenza B by PCR: NEGATIVE
Resp Syncytial Virus by PCR: NEGATIVE
SARS Coronavirus 2 by RT PCR: NEGATIVE

## 2023-08-23 MED ORDER — SODIUM CHLORIDE 0.9 % IV BOLUS
1000.0000 mL | Freq: Once | INTRAVENOUS | Status: AC
  Administered 2023-08-23: 1000 mL via INTRAVENOUS

## 2023-08-23 MED ORDER — ONDANSETRON HCL 4 MG/2ML IJ SOLN
4.0000 mg | Freq: Once | INTRAMUSCULAR | Status: AC
  Administered 2023-08-23: 4 mg via INTRAVENOUS
  Filled 2023-08-23: qty 2

## 2023-08-23 MED ORDER — ONDANSETRON 4 MG PO TBDP
4.0000 mg | ORAL_TABLET | Freq: Three times a day (TID) | ORAL | 0 refills | Status: AC | PRN
  Filled 2023-08-23: qty 20, 7d supply, fill #0

## 2023-08-23 NOTE — Discharge Instructions (Signed)
Please continue to drink plenty of fluids and get plenty of rest.  I would stick with a low calorie diet for the next couple of days and slowly build up to a very normal regular diet for you.  I have given you Zofran you can take for any nausea that you experience at home.  Please follow-up with your primary care doctor.  You may return to the emergency department for any worsening symptoms.

## 2023-08-23 NOTE — ED Provider Notes (Signed)
[place] AT [facility] Provider Note   CSN: [id number] Arrival date & time: 08/23/23  1055     History Chief Complaint  Patient presents with   Emesis    Antonio [name] is a 38 y.o. adult patient who presents to the emergency department today with 2 episodes of vomiting and some constant nausea this morning.  Patient states over the last several days he has not been eating a significant amount of food as he has just been hanging out at home.  He did not feel poorly during that time.  He states that he had a large dinner last night and woke up this morning feeling nauseous with no appetite.  Shortly after clocking into work he had a couple episodes of vomiting and is continuing to feel nauseous.  He denies any abdominal pain, fever, or chills.  No urinary symptoms.   Emesis      Home Medications Prior to Admission medications   Medication Sig Start Date End Date Taking? Authorizing Provider  meloxicam (MOBIC) 15 MG tablet Take 1 tablet by mouth daily. 08/08/23  Yes [provider]  ondansetron (ZOFRAN-ODT) 4 MG disintegrating tablet Take 1 tablet (4 mg total) by mouth every 8 (eight) hours as needed for nausea or vomiting. 08/23/23  Yes Honor Loh M, PA-C  acetaminophen (TYLENOL) 500 MG tablet Take 2 tablets (1,000 mg total) by mouth every 8 (eight) hours as needed. Patient not taking: Reported on 02/16/2023 05/22/22   Maczis, Elmer Sow, PA-C  acetaminophen (TYLENOL) 500 MG tablet Take 2 tablets (1,000 mg total) by mouth every 6 (six) hours as needed for mild pain. 04/02/23   Meuth, Lina Sar, PA-C  amphetamine-dextroamphetamine (ADDERALL XR) 20 MG 24 hr capsule Take 20 mg by mouth 2 (two) times daily. 12/08/22   [provider]  amphetamine-dextroamphetamine (ADDERALL XR) 20 MG 24 hr capsule Take 1 capsule (20 mg total) by mouth 2 (two) times daily. 08/13/23   Mozingo, Thereasa Solo, NP  Ascorbic Acid (VITAMIN C PO) Take 1 tablet by  mouth daily.    [provider]  buPROPion (WELLBUTRIN XL) 150 MG 24 hr tablet Take 3 tablets (450 mg total) by mouth every morning. 03/22/23   Mozingo, Thereasa Solo, NP  buPROPion (WELLBUTRIN XL) 150 MG 24 hr tablet Take 3 tablets (450 mg total) by mouth daily. 06/21/23   Mozingo, Thereasa Solo, NP  docusate sodium (COLACE) 100 MG capsule Take 1 capsule (100 mg total) by mouth 2 (two) times daily. 04/02/23   Meuth, Brooke A, PA-C  escitalopram (LEXAPRO) 20 MG tablet TAKE 1 TABLET DAILY 06/21/23   Mozingo, Thereasa Solo, NP  Ferrous Sulfate (IRON PO) Take 1 tablet by mouth daily.    [provider]  folic acid (FOLVITE) 1 MG tablet Take 1 tablet (1 mg total) by mouth daily. Patient not taking: Reported on 02/16/2023 05/22/22   Maczis, Elmer Sow, PA-C  HYDROcodone-acetaminophen (NORCO) 7.5-325 MG tablet Take 1 tablet by mouth every 6 (six) hours as needed for moderate pain. 02/16/23   Jerilee Field, MD  ibuprofen (ADVIL) 200 MG tablet Take 1 tablet (200 mg total) by mouth every 6 (six) hours as needed for moderate pain (pain not controlled with tylenol). 04/02/23   Meuth, Brooke A, PA-C  methocarbamol (ROBAXIN) 500 MG tablet Take 1-2 tablets (500-1,000 mg total) by mouth every 6 (six) hours as needed for muscle spasms. 04/02/23   Meuth, Brooke A, PA-C  Multiple Vitamin (MULTIVITAMIN WITH MINERALS) TABS  tablet Take 1 tablet by mouth daily. Patient not taking: Reported on 02/16/2023 05/22/22   Jacinto Halim, PA-C  Multiple Vitamin (MULTIVITAMIN WITH MINERALS) TABS tablet Take 1 tablet by mouth daily. 04/03/23   Meuth, Brooke A, PA-C  oxyCODONE 10 MG TABS Take 0.5-1 tablets (5-10 mg total) by mouth every 6 (six) hours as needed for severe pain or moderate pain. 04/02/23   Meuth, Brooke A, PA-C  pantoprazole (PROTONIX) 40 MG tablet Take 1 tablet (40 mg total) by mouth daily. 02/16/14   Daphine Deutscher, Mary-Margaret, FNP  pantoprazole (PROTONIX) 40 MG tablet Take 40 mg by mouth daily. 12/08/22    [provider]  polyethylene glycol (MIRALAX / GLYCOLAX) 17 g packet Take 17 g by mouth daily as needed for mild constipation. 04/02/23   Meuth, Brooke A, PA-C  REXULTI 0.5 MG TABS Take 1 tablet (0.5 mg total) by mouth daily. Patient not taking: Reported on 02/16/2023 11/16/22   Mozingo, Thereasa Solo, NP  thiamine (VITAMIN B-1) 100 MG tablet Take 1 tablet (100 mg total) by mouth daily. Patient not taking: Reported on 02/16/2023 05/22/22   Jacinto Halim, PA-C  traZODone (DESYREL) 50 MG tablet Take 100 mg by mouth at bedtime as needed. 03/22/23   [provider]  traZODone (DESYREL) 50 MG tablet Take one to two tablets at bedtime as needed. 06/21/23   Mozingo, Thereasa Solo, NP      Allergies    Patient has no known allergies.    Review of Systems   Review of Systems  Gastrointestinal:  Positive for vomiting.  All other systems reviewed and are negative.   Physical Exam Updated Vital Signs BP 110/70 (BP Location: Left Arm)   Pulse 88   Temp 98.1 F (36.7 C)   Resp 18   SpO2 100%  Physical Exam Vitals and nursing note reviewed.  Constitutional:      General: She is not in acute distress.    Appearance: Normal appearance.  HENT:     Head: Normocephalic and atraumatic.  Eyes:     General:        Right eye: No discharge.        Left eye: No discharge.  Cardiovascular:     Comments: Regular rate and rhythm.  S1/S2 are distinct without any evidence of murmur, rubs, or gallops.  Radial pulses are 2+ bilaterally.  Dorsalis pedis pulses are 2+ bilaterally.  No evidence of pedal edema. Pulmonary:     Comments: Clear to auscultation bilaterally.  Normal effort.  No respiratory distress.  No evidence of wheezes, rales, or rhonchi heard throughout. Abdominal:     General: Abdomen is flat. Bowel sounds are normal. There is no distension.     Tenderness: There is no abdominal tenderness. There is no guarding or rebound.  Musculoskeletal:        General: Normal range  of motion.     Cervical back: Neck supple.  Skin:    General: Skin is warm and dry.     Findings: No rash.  Neurological:     General: No focal deficit present.     Mental Status: She is alert.  Psychiatric:        Mood and Affect: Mood normal.        Behavior: Behavior normal.     ED Results / Procedures / Treatments   Labs (all labs ordered are listed, but only abnormal results are displayed) Labs Reviewed  COMPREHENSIVE METABOLIC PANEL - Abnormal; Notable for the following  components:      Result Value   Calcium 8.4 (*)    Total Protein 6.3 (*)    Total Bilirubin 1.3 (*)    All other components within normal limits  RESP PANEL BY RT-PCR (RSV, FLU A&B, COVID)  RVPGX2  LIPASE, BLOOD  CBC  URINALYSIS, ROUTINE W REFLEX MICROSCOPIC    EKG None  Radiology No results found.  Procedures Procedures    Medications Ordered in ED Medications  sodium chloride 0.9 % bolus 1,000 mL (0 mLs Intravenous Stopped 08/23/23 1346)  ondansetron (ZOFRAN) injection 4 mg (4 mg Intravenous Given 08/23/23 1143)    ED Course/ Medical Decision Making/ A&P Clinical Course as of 08/23/23 1353  Thu Aug 23, 2023  1223 CBC Negative. [CF]  1225 Lipase, blood Normal. [CF]  1226 Comprehensive metabolic panel(!) Negative. [CF]  1321 On reevaluation, patient states that he is feeling much better.  Will plan to fluid challenge him. [CF]  1349 Patient tolerated p.o. challenge. [CF]    Clinical Course User Index [CF] Teressa Lower, PA-C   {   Click here for ABCD2, HEART and other calculators  Medical Decision Making STEPHANOS TIET is a 89 y.o. adult patient who presents to the emergency department today for further evaluation of nausea and vomiting.  There is no abdominal tenderness to indicate any formal imaging is necessary at this time.  Vital signs are normal.  Will plan to get some basic labs to look for liver enzyme function, kidney function, pancreatic function, signs of systemic  infection.  Will plan to give patient some Zofran in addition to a liter of fluids and plan to reassess with an fluid challenge.  I will be respiratory panel to look for any signs of influenza that could be manifesting with GI symptoms.  Likely gastroenteritis.  Will treat with Zofran ODT and conservatively with bolus fluids.  Patient feeling better overall.  Safer discharge.  Strict turn precautions were discussed.  He is safe for discharge.  Amount and/or Complexity of Data Reviewed Labs: ordered. Decision-making details documented in ED Course.  Risk Prescription drug management.   Final Clinical Impression(s) / ED Diagnoses Final diagnoses:  Nausea and vomiting, unspecified vomiting type    Rx / DC Orders ED Discharge Orders          Ordered    ondansetron (ZOFRAN-ODT) 4 MG disintegrating tablet  Every 8 hours PRN        08/23/23 1349              Honor Loh [place], [place] 08/23/23 1353    Linwood Dibbles, MD 08/23/23 1800

## 2023-08-23 NOTE — ED Triage Notes (Signed)
Pt bib [facility], c/o ABD pain with n/v today, endorses recent fasting

## 2023-08-23 NOTE — ED Notes (Signed)
Passed PO challenge

## 2023-09-21 ENCOUNTER — Ambulatory Visit: Admitting: Adult Health

## 2023-10-10 ENCOUNTER — Other Ambulatory Visit: Payer: Self-pay

## 2023-10-10 ENCOUNTER — Telehealth: Payer: Self-pay | Admitting: Adult Health

## 2023-10-10 DIAGNOSIS — F909 Attention-deficit hyperactivity disorder, unspecified type: Secondary | ICD-10-CM

## 2023-10-10 NOTE — Telephone Encounter (Signed)
 LF 2/19, due 3/19

## 2023-10-10 NOTE — Telephone Encounter (Signed)
 Pt called at 1:47p requesting refill of Adderall to    [id number] - [place], [place] - [address] [place], [place] [address] Phone: [phone]  Fax: [phone]

## 2023-10-17 ENCOUNTER — Other Ambulatory Visit: Payer: Self-pay

## 2023-10-17 DIAGNOSIS — F909 Attention-deficit hyperactivity disorder, unspecified type: Secondary | ICD-10-CM

## 2023-10-17 MED ORDER — AMPHETAMINE-DEXTROAMPHET ER 20 MG PO CP24: 20.0000 mg | ORAL_CAPSULE | Freq: Two times a day (BID) | ORAL | 0 refills | Status: DC

## 2023-10-17 NOTE — Telephone Encounter (Signed)
Pended Adderall 

## 2023-11-12 ENCOUNTER — Ambulatory Visit (INDEPENDENT_AMBULATORY_CARE_PROVIDER_SITE_OTHER): Admitting: Adult Health

## 2023-11-12 DIAGNOSIS — Z0389 Encounter for observation for other suspected diseases and conditions ruled out: Secondary | ICD-10-CM

## 2023-11-12 NOTE — Progress Notes (Signed)
 Patient no show appointment. ? ?

## 2023-11-29 ENCOUNTER — Encounter: Payer: Self-pay | Admitting: Adult Health

## 2023-11-29 ENCOUNTER — Telehealth (INDEPENDENT_AMBULATORY_CARE_PROVIDER_SITE_OTHER): Admitting: Adult Health

## 2023-11-29 DIAGNOSIS — F411 Generalized anxiety disorder: Secondary | ICD-10-CM

## 2023-11-29 DIAGNOSIS — F909 Attention-deficit hyperactivity disorder, unspecified type: Secondary | ICD-10-CM | POA: Diagnosis not present

## 2023-11-29 DIAGNOSIS — G4700 Insomnia, unspecified: Secondary | ICD-10-CM | POA: Diagnosis not present

## 2023-11-29 DIAGNOSIS — F331 Major depressive disorder, recurrent, moderate: Secondary | ICD-10-CM | POA: Diagnosis not present

## 2023-11-29 MED ORDER — TRAZODONE HCL 50 MG PO TABS
ORAL_TABLET | ORAL | 2 refills | Status: DC
Start: 2023-11-29 — End: 2024-02-27

## 2023-11-29 MED ORDER — ESCITALOPRAM OXALATE 20 MG PO TABS
ORAL_TABLET | ORAL | 2 refills | Status: DC
Start: 2023-11-29 — End: 2024-01-24

## 2023-11-29 MED ORDER — AMPHETAMINE-DEXTROAMPHET ER 20 MG PO CP24
20.0000 mg | ORAL_CAPSULE | Freq: Every day | ORAL | 0 refills | Status: DC
Start: 2023-11-29 — End: 2024-01-24

## 2023-11-29 MED ORDER — BUPROPION HCL ER (XL) 150 MG PO TB24
450.0000 mg | ORAL_TABLET | Freq: Every morning | ORAL | 2 refills | Status: DC
Start: 2023-11-29 — End: 2024-02-27

## 2023-11-29 NOTE — Progress Notes (Signed)
 Antonio [name] [id number] December 30, 1985 38 y.o.  Virtual Visit via Video Note  I connected with pt @ on 11/29/23 at  3:30 PM EDT by a video enabled telemedicine application and verified that I am speaking with the correct person using two identifiers.   I discussed the limitations of evaluation and management by telemedicine and the availability of in person appointments. The patient expressed understanding and agreed to proceed.  I discussed the assessment and treatment plan with the patient. The patient was provided an opportunity to ask questions and all were answered. The patient agreed with the plan and demonstrated an understanding of the instructions.   The patient was advised to call back or seek an in-person evaluation if the symptoms worsen or if the condition fails to improve as anticipated.  I provided 25 minutes of non-face-to-face time during this encounter.  The patient was located at home.  The provider was located at [facility].   Reagan Camera, NP   Subjective:   Patient ID:  Antonio [name] is a 38 y.o. (DOB Aug 21, 1985) male.  Chief Complaint: No chief complaint on file.   HPI Antonio [name] presents for follow-up of anxiety, depression, insomnia, ADHD and PTSD.  Describes mood today as "ok". Pleasant. Denies tearfukness. Mood symptoms - reports some depression. Reports varying interest and motivation. Denies anxiety and irritability. Denies panic attacks. Denies worry, rumination and over thinking. Mood is consistent. Stating "I feel like I'm doing good". Feels like current medication regimen works well. Taking medications as prescribe Energy levels vary. Active, does not have a regular exercise routine.  Enjoys some usual interests and activities. Married. Lives with wife and dog - "[name]". Family local. Working full time English as a second language teacher.  Appetite stable. Weight stable - 140 to 150 pounds. Sleeps well most nights. Averages 8 to 9 hours. Focus  and concentration stable - using Adderall as needed. Completing tasks. Managing aspects of household. 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  Denies SI or HI.  Denies AH or VH. Denies self harm. Denies substance use.   Review of Systems:  Review of Systems  Musculoskeletal:  Negative for gait problem.  Neurological:  Negative for tremors.  Psychiatric/Behavioral:         Please refer to HPI    Medications: I have reviewed the patient's current medications.  Current Outpatient Medications  Medication Sig Dispense Refill   acetaminophen  (TYLENOL ) 500 MG tablet Take 2 tablets (1,000 mg total) by mouth every 8 (eight) hours as needed. (Patient not taking: Reported on 02/16/2023) 30 tablet 0   acetaminophen  (TYLENOL ) 500 MG tablet Take 2 tablets (1,000 mg total) by mouth every 6 (six) hours as needed for mild pain.     amphetamine -dextroamphetamine (ADDERALL XR) 20 MG 24 hr capsule Take 1 capsule (20 mg total) by mouth 2 (two) times daily. 60 capsule 0   amphetamine -dextroamphetamine (ADDERALL XR) 20 MG 24 hr capsule Take 1 capsule (20 mg total) by mouth daily. 30 capsule 0   Ascorbic Acid (VITAMIN C PO) Take 1 tablet by mouth daily.     buPROPion  (WELLBUTRIN  XL) 150 MG 24 hr tablet Take 3 tablets (450 mg total) by mouth daily. 90 tablet 5   buPROPion  (WELLBUTRIN  XL) 150 MG 24 hr tablet Take 3 tablets (450 mg total) by mouth every morning. 90 tablet 2   docusate sodium  (COLACE) 100 MG capsule Take 1 capsule (100 mg total) by mouth 2 (two) times daily.     escitalopram  (  LEXAPRO ) 20 MG tablet TAKE 1 TABLET DAILY 30 tablet 2   Ferrous Sulfate (IRON PO) Take 1 tablet by mouth daily.     folic acid  (FOLVITE ) 1 MG tablet Take 1 tablet (1 mg total) by mouth daily. (Patient not taking: Reported on 02/16/2023)     HYDROcodone -acetaminophen  (NORCO) 7.5-325 MG tablet Take 1 tablet by mouth every 6 (six) hours as needed for moderate pain. 15 tablet 0   ibuprofen  (ADVIL ) 200 MG tablet Take 1 tablet (200  mg total) by mouth every 6 (six) hours as needed for moderate pain (pain not controlled with tylenol ).     meloxicam (MOBIC) 15 MG tablet Take 1 tablet by mouth daily.     methocarbamol  (ROBAXIN ) 500 MG tablet Take 1-2 tablets (500-1,000 mg total) by mouth every 6 (six) hours as needed for muscle spasms. 30 tablet 0   Multiple Vitamin (MULTIVITAMIN WITH MINERALS) TABS tablet Take 1 tablet by mouth daily. (Patient not taking: Reported on 02/16/2023)     Multiple Vitamin (MULTIVITAMIN WITH MINERALS) TABS tablet Take 1 tablet by mouth daily.     ondansetron  (ZOFRAN -ODT) 4 MG disintegrating tablet Take 1 tablet (4 mg total) by mouth every 8 (eight) hours as needed for nausea or vomiting. 20 tablet 0   oxyCODONE  10 MG TABS Take 0.5-1 tablets (5-10 mg total) by mouth every 6 (six) hours as needed for severe pain or moderate pain. 20 tablet 0   pantoprazole  (PROTONIX ) 40 MG tablet Take 1 tablet (40 mg total) by mouth daily. 30 tablet 3   pantoprazole  (PROTONIX ) 40 MG tablet Take 40 mg by mouth daily.     polyethylene glycol (MIRALAX  / GLYCOLAX ) 17 g packet Take 17 g by mouth daily as needed for mild constipation.     REXULTI  0.5 MG TABS Take 1 tablet (0.5 mg total) by mouth daily. (Patient not taking: Reported on 02/16/2023) 90 tablet 0   thiamine  (VITAMIN B-1) 100 MG tablet Take 1 tablet (100 mg total) by mouth daily. (Patient not taking: Reported on 02/16/2023)     traZODone  (DESYREL ) 50 MG tablet Take 100 mg by mouth at bedtime as needed.     traZODone  (DESYREL ) 50 MG tablet Take one to two tablets at bedtime as needed. 60 tablet 2   No current facility-administered medications for this visit.    Medication Side Effects: None  Allergies: No Known Allergies  Past Medical History:  Diagnosis Date   Anxiety 11/02/2015   Chronic pain of left ankle 04/24/2018   Depression 11/02/2015   Gastroesophageal reflux disease    question underlying stricture as pt is vomiting up undigested food particles. will try  to obtain the ugi asap as he cannot be scheduled until feb 22.  spoke with dr kline who cannot add onto the schedule.  will attempt to schedule thru [facility] asap  Dec 04, 2008 Entered By: Lynnie Saucier R Comment: nl EGD 1/09   GERD (gastroesophageal reflux disease)    Hemangioma 01/24/2016   HLD (hyperlipidemia)    Nephrolithiasis 05/19/2022   OSA on CPAP 01/06/2016   Home sleep study: 12/22/2015 Mild Sleep Apnea  Total apnea events over 3.6 hours = 22 Hypoxemia events for an index of 7.9 hours=48 10 obstructive apneas 11 central apneas 1 mixed apnea AHI= 11.6 per hour of sleep Cheyne-Stokes respirations were not observed Hypoventilation was not observed 41 desaturations occurred during the study Lowest saturation was 91% with an average of 95% Minimum saturati   Pityriasis versicolor 02/04/2019  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   Suicidal ideation 02/04/2019   Vitamin D  deficiency     No family history on file.  Social History   Socioeconomic History   Marital status: Married    Spouse name: Not on file   Number of children: Not on file   Years of education: Not on file   Highest education level: Not on file  Occupational History   Occupation: Build guns  Tobacco Use   Smoking status: Never   Smokeless tobacco: Never  Vaping Use   Vaping status: Never Used  Substance and Sexual Activity   Alcohol use: Yes    Alcohol/week: 10.0 - 15.0 standard drinks of alcohol    Types: 10 - 15 Cans of beer per week    Comment: 3-4 beers daily   Drug use: No   Sexual activity: Not on file  Other Topics Concern   Not on file  Social History Narrative   ** Merged History Encounter **       Social Drivers of Health   Financial Resource Strain: Not on file  Food Insecurity: Low Risk  (10/17/2023)   Received from [facility]   Hunger Vital Sign    Worried About Running Out of Food in the Last Year: Never true    Ran Out of Food in the Last Year: Never true  Recent  Concern: Food Insecurity - High Risk (07/28/2023)   Received from [facility]   Hunger Vital Sign    Worried About Running Out of Food in the Last Year: Often true    Ran Out of Food in the Last Year: Never true  Transportation Needs: No Transportation Needs (10/17/2023)   Received from [facility]    In the past 12 months, has lack of reliable transportation kept you from medical appointments, meetings, work or from getting things needed for daily living? : No  Physical Activity: Not on file  Stress: Not on file  Social Connections: Not on file  Intimate Partner Violence: Not At Risk (03/30/2023)   Humiliation, Afraid, Rape, and Kick questionnaire    Fear of Current or Ex-Partner: No    Emotionally Abused: No    Physically Abused: No    Sexually Abused: No    Past Medical History, Surgical history, Social history, and Family history were reviewed and updated as appropriate.   Please see review of systems for further details on the patient's review from today.   Objective:   Physical Exam:  There were no vitals taken for this visit.  Physical Exam Constitutional:      General: He is not in acute distress. Musculoskeletal:        General: No deformity.  Neurological:     Mental Status: He is alert and oriented to person, place, and time.     Coordination: Coordination normal.  Psychiatric:        Attention and Perception: Attention and perception normal. He does not perceive auditory or visual hallucinations.        Mood and Affect: Mood normal. Mood is not anxious or depressed. Affect is not labile, blunt, angry or inappropriate.        Speech: Speech normal.        Behavior: Behavior normal.        Thought Content: Thought content normal. Thought content is not paranoid or delusional. Thought content does not include homicidal or suicidal ideation. Thought content does not include homicidal or suicidal plan.  Cognition and Memory: Cognition and  memory normal.        Judgment: Judgment normal.     Comments: Insight intact     Lab Review:     Component Value Date/Time   NA 136 08/23/2023 1134   NA 142 02/16/2014 1501   K 3.7 08/23/2023 1134   CL 100 08/23/2023 1134   CO2 31 08/23/2023 1134   GLUCOSE 92 08/23/2023 1134   BUN 17 08/23/2023 1134   BUN 13 02/16/2014 1501   CREATININE 1.07 08/23/2023 1134   CALCIUM 8.4 (L) 08/23/2023 1134   PROT 6.3 (L) 08/23/2023 1134   PROT 6.6 02/16/2014 1501   ALBUMIN 4.3 08/23/2023 1134   ALBUMIN 4.9 02/16/2014 1501   AST 25 08/23/2023 1134   ALT 19 08/23/2023 1134   ALKPHOS 77 08/23/2023 1134   BILITOT 1.3 (H) 08/23/2023 1134   GFRNONAA >60 08/23/2023 1134   GFRAA >60 02/22/2019 0239       Component Value Date/Time   WBC 5.4 08/23/2023 1134   RBC 4.83 08/23/2023 1134   HGB 14.3 08/23/2023 1134   HCT 41.1 08/23/2023 1134   PLT 270 08/23/2023 1134   MCV 85.1 08/23/2023 1134   MCV 86.9 08/01/2013 1040   MCH 29.6 08/23/2023 1134   MCHC 34.8 08/23/2023 1134   RDW 11.7 08/23/2023 1134   LYMPHSABS 1.0 02/16/2023 0400   MONOABS 1.5 (H) 02/16/2023 0400   EOSABS 0.0 02/16/2023 0400   BASOSABS 0.0 02/16/2023 0400    No results found for: "POCLITH", "LITHIUM"   No results found for: "PHENYTOIN", "PHENOBARB", "VALPROATE", "CBMZ"   .res Assessment: Plan:    Plan:  Trazadone 50mg  - 1 to 2 at hs Lexapro  20mg  daily  Wellbutrin  XL 450mg  - 150mg  daily x 7 days, then increase 300mg  x 7 days - then increase to 450mg  after one month. Aderall XR 20mg  BID as needed  Plans to try and start taking medications more consistently. Plans to see PCP upcoming.  Monitor BP between visits while taking stimulant medication.   RTC 3 months  25 minutes spent dedicated to the care of this patient on the date of this encounter to include pre-visit review of records, ordering of medication, post visit documentation, and face-to-face time with the patient discussing anxiety, depression, insomnia,  ADHD and PTSD. Discussed continuing current medication regimen.  Patient advised to contact office with any questions, adverse effects, or acute worsening in signs and symptoms.  Discussed potential benefits, risks, and side effects of stimulants with patient to include increased heart rate, palpitations, insomnia, increased anxiety, increased irritability, or decreased appetite.  Instructed patient to contact office if experiencing any significant tolerability issues. Diagnoses and all orders for this visit:  Major depressive disorder, recurrent episode, moderate (HCC) -     buPROPion  (WELLBUTRIN  XL) 150 MG 24 hr tablet; Take 3 tablets (450 mg total) by mouth every morning. -     escitalopram  (LEXAPRO ) 20 MG tablet; TAKE 1 TABLET DAILY  Attention deficit hyperactivity disorder (ADHD), unspecified ADHD type -     buPROPion  (WELLBUTRIN  XL) 150 MG 24 hr tablet; Take 3 tablets (450 mg total) by mouth every morning. -     amphetamine -dextroamphetamine (ADDERALL XR) 20 MG 24 hr capsule; Take 1 capsule (20 mg total) by mouth daily.  Generalized anxiety disorder -     escitalopram  (LEXAPRO ) 20 MG tablet; TAKE 1 TABLET DAILY  Insomnia, unspecified type -     traZODone  (DESYREL ) 50 MG tablet; Take one to two  tablets at bedtime as needed.     Please see After Visit Summary for patient specific instructions.  Future Appointments  Date Time Provider Department Center  02/13/2024  8:30 AM Tedi Hughson Nattalie, NP [facility] None    No orders of the defined types were placed in this encounter.     -------------------------------

## 2023-12-18 ENCOUNTER — Telehealth: Payer: Self-pay | Admitting: *Deleted

## 2023-12-18 NOTE — Telephone Encounter (Unsigned)
 Copied from CRM [id number]. Topic: Medical Record Request - Records Request >> Dec 12, 2023  7:49 AM Margarette Shawl wrote: Reason for CRM:   Pt is a past patient of Dr. Matilde Son. Is requesting copies of his most recent sleep study, to provide to his superiors at the [facility].   He is also requesting if his results could be explained to him when he comes to pick up the documents.   CB#  [phone] >> Dec 18, 2023 12:24 PM Hilton Lucky wrote: Patient is requesting an update on this. Please call patient when a copy is ready for pickup.   I spoke with the pt. He has a copy of the sleep study. He asked about the results and per encounter 07/16/18 I advised him no OSA and no f/u needed. He verbalized understanding. Nothing further needed.

## 2023-12-25 NOTE — Telephone Encounter (Signed)
 Left pt message to return call at # listed in chart as [phone],  it is off a number in the previous message

## 2023-12-25 NOTE — Telephone Encounter (Signed)
 Patient presented at [facility] office. His November 2019 study was printed for him. He is requesting for the study to be summarized in to does he or does he not have sleep apnea. Explained to patient that may not be possible as he is no longer a patient. Please advise.

## 2023-12-25 NOTE — Telephone Encounter (Signed)
 Patient was given the information he requested. All that is available to us  is his 06/16/18 sleep study which has all the diagnostic information. Carolyn Cisco, can you call patient to see what's confusing him or what else he is needing? He is no longer established as his last visit was in October 2019.

## 2023-12-25 NOTE — Telephone Encounter (Signed)
 Patient is calling cause he just picked up paperwork about his sleep study in 2019. Patient is saying that the sleep study paperwork is not clear and basically saying he is needing a cpap. Patient remember it being that he didn't need a cpap machine . Patient is needing information to be clear .he can give this letter to leadership at his job. The paperwork he picked today doesn't make any sense to patient . This is the [employer] job is needing this information . Patient is saying that its not clear   Patient is needing to speak with someone as soon as possible . It has to be clear like if patient needs a cpap make that clear or if he doesn't need a cpap. The paperwork is not clear is what patient is saying . Patient says he has been waiting 2 weeks for paperwork and he need this asap please Please give patient a call asap please  [phone]

## 2024-01-24 ENCOUNTER — Telehealth: Payer: Self-pay | Admitting: Adult Health

## 2024-01-24 ENCOUNTER — Other Ambulatory Visit: Payer: Self-pay | Admitting: Adult Health

## 2024-01-24 DIAGNOSIS — F411 Generalized anxiety disorder: Secondary | ICD-10-CM

## 2024-01-24 DIAGNOSIS — F331 Major depressive disorder, recurrent, moderate: Secondary | ICD-10-CM

## 2024-01-24 NOTE — Telephone Encounter (Signed)
 PMPD shows filled 6/2, ? If he picked up. Updated med list in Epic to remove once daily dosing. LVM to [facility].

## 2024-01-24 NOTE — Telephone Encounter (Signed)
 Pt called and said that the adderall that was sent in was wrong. He takes two pills a day not one. Please send in correct script

## 2024-01-25 ENCOUNTER — Other Ambulatory Visit: Payer: Self-pay

## 2024-01-25 DIAGNOSIS — F909 Attention-deficit hyperactivity disorder, unspecified type: Secondary | ICD-10-CM

## 2024-01-25 MED ORDER — AMPHETAMINE-DEXTROAMPHET ER 20 MG PO CP24
20.0000 mg | ORAL_CAPSULE | Freq: Two times a day (BID) | ORAL | 0 refills | Status: DC
Start: 2024-01-25 — End: 2024-02-27

## 2024-01-25 NOTE — Telephone Encounter (Signed)
 Pt did pick up RF on 6/2, but it was for once a day and he takes BID. New Rx sent 6/27 with notation that previous Rx was incorrect.

## 2024-01-29 ENCOUNTER — Telehealth (HOSPITAL_BASED_OUTPATIENT_CLINIC_OR_DEPARTMENT_OTHER): Payer: Self-pay

## 2024-01-29 NOTE — Telephone Encounter (Signed)
 Copied from CRM [id number]. Topic: Medical Record Request - Records Request >> Jan 28, 2024 10:38 AM Leila C wrote: Reason for CRM: Patient states had a sleep study 2018 and 2019, Dr. Shellia advise patient does not have sleep apnea nor needed a cpap machine. Patient states the [employer] needs the documentation that patient does not need a cpap machine because he does not have a sleep apnea mailed to patient's home or pick up from the office. Patient called last month 12/25/23 about this and the letter he received there's no notes with Dr. Shellia, no results from the sleep study 2019 and that patient needs a cpap machine. Please see 07/16/2018 telephone results (sleep study) by Dr. Carolynne Shellia. Please call back at [phone], the deadline is 02/27/24. Patient does not use MyChart.

## 2024-02-13 ENCOUNTER — Ambulatory Visit: Admitting: Adult Health

## 2024-02-20 ENCOUNTER — Telehealth: Payer: Self-pay | Admitting: Adult Health

## 2024-02-20 NOTE — Telephone Encounter (Signed)
 Pt requesting Adderall XR 20 mg 2/d.  [facility] Apt 7/30

## 2024-02-20 NOTE — Telephone Encounter (Signed)
 Pt has a RF on file. LVM to [facility].

## 2024-02-26 ENCOUNTER — Ambulatory Visit (HOSPITAL_BASED_OUTPATIENT_CLINIC_OR_DEPARTMENT_OTHER): Admitting: Primary Care

## 2024-02-27 ENCOUNTER — Telehealth: Admitting: Adult Health

## 2024-02-27 ENCOUNTER — Encounter: Payer: Self-pay | Admitting: Adult Health

## 2024-02-27 DIAGNOSIS — F331 Major depressive disorder, recurrent, moderate: Secondary | ICD-10-CM | POA: Diagnosis not present

## 2024-02-27 DIAGNOSIS — F909 Attention-deficit hyperactivity disorder, unspecified type: Secondary | ICD-10-CM

## 2024-02-27 DIAGNOSIS — G4700 Insomnia, unspecified: Secondary | ICD-10-CM | POA: Diagnosis not present

## 2024-02-27 DIAGNOSIS — F411 Generalized anxiety disorder: Secondary | ICD-10-CM

## 2024-02-27 MED ORDER — AMPHETAMINE-DEXTROAMPHET ER 20 MG PO CP24: 20.0000 mg | ORAL_CAPSULE | Freq: Two times a day (BID) | ORAL | 0 refills | Status: AC

## 2024-02-27 MED ORDER — TRAZODONE HCL 50 MG PO TABS
ORAL_TABLET | ORAL | 2 refills | Status: DC
Start: 2024-02-27 — End: 2024-05-29

## 2024-02-27 MED ORDER — BUPROPION HCL ER (XL) 150 MG PO TB24: 450.0000 mg | ORAL_TABLET | Freq: Every morning | ORAL | 2 refills | Status: DC

## 2024-02-27 MED ORDER — AMPHETAMINE-DEXTROAMPHET ER 20 MG PO CP24: 20.0000 mg | ORAL_CAPSULE | Freq: Two times a day (BID) | ORAL | 0 refills | Status: DC

## 2024-02-27 MED ORDER — ESCITALOPRAM OXALATE 20 MG PO TABS: 20.0000 mg | ORAL_TABLET | Freq: Every day | ORAL | 2 refills | Status: DC

## 2024-02-27 NOTE — Progress Notes (Signed)
 Antonio [name] [id number] 1986/06/24 37 y.o.  Virtual Visit via Video Note  I connected with pt @ on 02/27/24 at  4:30 PM EDT by a video enabled telemedicine application and verified that I am speaking with the correct person using two identifiers.   I discussed the limitations of evaluation and management by telemedicine and the availability of in person appointments. The patient expressed understanding and agreed to proceed.  I discussed the assessment and treatment plan with the patient. The patient was provided an opportunity to ask questions and all were answered. The patient agreed with the plan and demonstrated an understanding of the instructions.   The patient was advised to call back or seek an in-person evaluation if the symptoms worsen or if the condition fails to improve as anticipated.  I provided 25 minutes of non-face-to-face time during this encounter.  The patient was located at home.  The provider was located at [facility].   Antonio LOISE Sayers, NP   Subjective:   Patient ID:  Antonio [name] is a 38 y.o. (DOB 01/10/1986) male.  Chief Complaint: No chief complaint on file.   HPI Antonio [name] presents for follow-up of anxiety, depression, insomnia, ADHD and PTSD.  Describes mood today as ok. Pleasant. Denies tearfulness. Mood symptoms - denies anxiety, depression and irritability. Reports varying interest and motivation - tired. Denies panic attacks. Denies worry, rumination and over thinking. Reports mood is stable. Stating I feel like I'm doing ok. Feels like current medication regimen works well. Taking medications as prescribe Energy levels vary. Active, does not have a regular exercise routine.  Enjoys some usual interests and activities. Married. Lives with wife and dog - Antonio [name]. Family local.  Appetite stable. Weight stable - 140 pounds. Sleeps well most nights. Averages 8 to 9 hours. Focus and concentration stable - using Adderall  daily. Completing tasks. Managing aspects of household. 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  Denies SI or HI.  Denies AH or VH. Denies self harm. Denies substance use.  Review of Systems:  Review of Systems  Musculoskeletal:  Negative for gait problem.  Neurological:  Negative for tremors.  Psychiatric/Behavioral:         Please refer to HPI    Medications: I have reviewed the patient's current medications.  Current Outpatient Medications  Medication Sig Dispense Refill   [START ON 03/26/2024] amphetamine -dextroamphetamine (ADDERALL XR) 20 MG 24 hr capsule Take 1 capsule (20 mg total) by mouth 2 (two) times daily. 60 capsule 0   [START ON 04/23/2024] amphetamine -dextroamphetamine (ADDERALL XR) 20 MG 24 hr capsule Take 1 capsule (20 mg total) by mouth 2 (two) times daily. 60 capsule 0   acetaminophen  (TYLENOL ) 500 MG tablet Take 2 tablets (1,000 mg total) by mouth every 8 (eight) hours as needed. (Patient not taking: Reported on 02/16/2023) 30 tablet 0   acetaminophen  (TYLENOL ) 500 MG tablet Take 2 tablets (1,000 mg total) by mouth every 6 (six) hours as needed for mild pain.     amphetamine -dextroamphetamine (ADDERALL XR) 20 MG 24 hr capsule Take 1 capsule (20 mg total) by mouth 2 (two) times daily. 60 capsule 0   Ascorbic Acid (VITAMIN C PO) Take 1 tablet by mouth daily.     buPROPion  (WELLBUTRIN  XL) 150 MG 24 hr tablet Take 3 tablets (450 mg total) by mouth daily. 90 tablet 5   buPROPion  (WELLBUTRIN  XL) 150 MG 24 hr tablet Take 3 tablets (450 mg total) by  mouth every morning. 90 tablet 2   docusate sodium  (COLACE) 100 MG capsule Take 1 capsule (100 mg total) by mouth 2 (two) times daily.     escitalopram  (LEXAPRO ) 20 MG tablet Take 1 tablet (20 mg total) by mouth daily. 30 tablet 2   Ferrous Sulfate (IRON PO) Take 1 tablet by mouth daily.     folic acid  (FOLVITE ) 1 MG tablet Take 1 tablet (1 mg total) by mouth daily. (Patient not taking: Reported on 02/16/2023)      HYDROcodone -acetaminophen  (NORCO) 7.5-325 MG tablet Take 1 tablet by mouth every 6 (six) hours as needed for moderate pain. 15 tablet 0   ibuprofen  (ADVIL ) 200 MG tablet Take 1 tablet (200 mg total) by mouth every 6 (six) hours as needed for moderate pain (pain not controlled with tylenol ).     meloxicam (MOBIC) 15 MG tablet Take 1 tablet by mouth daily.     methocarbamol  (ROBAXIN ) 500 MG tablet Take 1-2 tablets (500-1,000 mg total) by mouth every 6 (six) hours as needed for muscle spasms. 30 tablet 0   Multiple Vitamin (MULTIVITAMIN WITH MINERALS) TABS tablet Take 1 tablet by mouth daily. (Patient not taking: Reported on 02/16/2023)     Multiple Vitamin (MULTIVITAMIN WITH MINERALS) TABS tablet Take 1 tablet by mouth daily.     ondansetron  (ZOFRAN -ODT) 4 MG disintegrating tablet Take 1 tablet (4 mg total) by mouth every 8 (eight) hours as needed for nausea or vomiting. 20 tablet 0   oxyCODONE  10 MG TABS Take 0.5-1 tablets (5-10 mg total) by mouth every 6 (six) hours as needed for severe pain or moderate pain. 20 tablet 0   pantoprazole  (PROTONIX ) 40 MG tablet Take 1 tablet (40 mg total) by mouth daily. 30 tablet 3   pantoprazole  (PROTONIX ) 40 MG tablet Take 40 mg by mouth daily.     polyethylene glycol (MIRALAX  / GLYCOLAX ) 17 g packet Take 17 g by mouth daily as needed for mild constipation.     REXULTI  0.5 MG TABS Take 1 tablet (0.5 mg total) by mouth daily. (Patient not taking: Reported on 02/16/2023) 90 tablet 0   thiamine  (VITAMIN B-1) 100 MG tablet Take 1 tablet (100 mg total) by mouth daily. (Patient not taking: Reported on 02/16/2023)     traZODone  (DESYREL ) 50 MG tablet Take 100 mg by mouth at bedtime as needed.     traZODone  (DESYREL ) 50 MG tablet Take one to two tablets at bedtime as needed. 60 tablet 2   No current facility-administered medications for this visit.    Medication Side Effects: None  Allergies: No Known Allergies  Past Medical History:  Diagnosis Date   Anxiety  11/02/2015   Chronic pain of left ankle 04/24/2018   Depression 11/02/2015   Gastroesophageal reflux disease    question underlying stricture as pt is vomiting up undigested food particles. will try to obtain the ugi asap as he cannot be scheduled until feb 22.  spoke with dr kline who cannot add onto the schedule.  will attempt to schedule thru [facility] asap  Dec 04, 2008 Entered By: CORBETT PAC R Comment: nl EGD 1/09   GERD (gastroesophageal reflux disease)    Hemangioma 01/24/2016   HLD (hyperlipidemia)    Nephrolithiasis 05/19/2022   OSA on CPAP 01/06/2016   Home sleep study: 12/22/2015 Mild Sleep Apnea  Total apnea events over 3.6 hours = 22 Hypoxemia events for an index of 7.9 hours=48 10 obstructive apneas 11 central apneas 1 mixed apnea AHI= 11.6  per hour of sleep Cheyne-Stokes respirations were not observed Hypoventilation was not observed 41 desaturations occurred during the study Lowest saturation was 91% with an average of 95% Minimum saturati   Pityriasis versicolor 02/04/2019   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   Suicidal ideation 02/04/2019   Vitamin D  deficiency     No family history on file.  Social History   Socioeconomic History   Marital status: Married    Spouse name: Not on file   Number of children: Not on file   Years of education: Not on file   Highest education level: Not on file  Occupational History   Occupation: Build guns  Tobacco Use   Smoking status: Never   Smokeless tobacco: Never  Vaping Use   Vaping status: Never Used  Substance and Sexual Activity   Alcohol use: Yes    Alcohol/week: 10.0 - 15.0 standard drinks of alcohol    Types: 10 - 15 Cans of beer per week    Comment: 3-4 beers daily   Drug use: No   Sexual activity: Not on file  Other Topics Concern   Not on file  Social History Narrative   ** Merged History Encounter **       Social Drivers of Health   Financial Resource Strain: Not on file  Food Insecurity:  Low Risk  (10/17/2023)   Received from [facility]   Hunger Vital Sign    Within the past 12 months, you worried that your food would run out before you got money to buy more: Never true    Within the past 12 months, the food you bought just didn't last and you didn't have money to get more. : Never true  Recent Concern: Food Insecurity - High Risk (07/28/2023)   Received from [facility]   Hunger Vital Sign    Worried About Running Out of Food in the Last Year: Often true    Ran Out of Food in the Last Year: Never true  Transportation Needs: No Transportation Needs (10/17/2023)   Received from [facility]    In the past 12 months, has lack of reliable transportation kept you from medical appointments, meetings, work or from getting things needed for daily living? : No  Physical Activity: Not on file  Stress: Not on file  Social Connections: Not on file  Intimate Partner Violence: Not At Risk (03/30/2023)   Humiliation, Afraid, Rape, and Kick questionnaire    Fear of Current or Ex-Partner: No    Emotionally Abused: No    Physically Abused: No    Sexually Abused: No    Past Medical History, Surgical history, Social history, and Family history were reviewed and updated as appropriate.   Please see review of systems for further details on the patient's review from today.   Objective:   Physical Exam:  There were no vitals taken for this visit.  Physical Exam Constitutional:      General: He is not in acute distress. Musculoskeletal:        General: No deformity.  Neurological:     Mental Status: He is alert and oriented to person, place, and time.     Coordination: Coordination normal.  Psychiatric:        Attention and Perception: Attention and perception normal. He does not perceive auditory or visual hallucinations.        Mood and Affect: Mood normal. Mood is not anxious or depressed. Affect is not labile, blunt,  angry or inappropriate.        Speech:  Speech normal.        Behavior: Behavior normal.        Thought Content: Thought content normal. Thought content is not paranoid or delusional. Thought content does not include homicidal or suicidal ideation. Thought content does not include homicidal or suicidal plan.        Cognition and Memory: Cognition and memory normal.        Judgment: Judgment normal.     Comments: Insight intact     Lab Review:     Component Value Date/Time   NA 136 08/23/2023 1134   NA 142 02/16/2014 1501   K 3.7 08/23/2023 1134   CL 100 08/23/2023 1134   CO2 31 08/23/2023 1134   GLUCOSE 92 08/23/2023 1134   BUN 17 08/23/2023 1134   BUN 13 02/16/2014 1501   CREATININE 1.07 08/23/2023 1134   CALCIUM 8.4 (L) 08/23/2023 1134   PROT 6.3 (L) 08/23/2023 1134   PROT 6.6 02/16/2014 1501   ALBUMIN 4.3 08/23/2023 1134   ALBUMIN 4.9 02/16/2014 1501   AST 25 08/23/2023 1134   ALT 19 08/23/2023 1134   ALKPHOS 77 08/23/2023 1134   BILITOT 1.3 (H) 08/23/2023 1134   GFRNONAA >60 08/23/2023 1134   GFRAA >60 02/22/2019 0239       Component Value Date/Time   WBC 5.4 08/23/2023 1134   RBC 4.83 08/23/2023 1134   HGB 14.3 08/23/2023 1134   HCT 41.1 08/23/2023 1134   PLT 270 08/23/2023 1134   MCV 85.1 08/23/2023 1134   MCV 86.9 08/01/2013 1040   MCH 29.6 08/23/2023 1134   MCHC 34.8 08/23/2023 1134   RDW 11.7 08/23/2023 1134   LYMPHSABS 1.0 02/16/2023 0400   MONOABS 1.5 (H) 02/16/2023 0400   EOSABS 0.0 02/16/2023 0400   BASOSABS 0.0 02/16/2023 0400    No results found for: POCLITH, LITHIUM   No results found for: PHENYTOIN, PHENOBARB, VALPROATE, CBMZ   .res Assessment: Plan:    Plan:  Trazadone 50mg  - 1 to 2 at hs Lexapro  20mg  daily  Wellbutrin  XL 450mg  daily Aderall XR 20mg  BID as needed  Monitor BP between visits while taking stimulant medication.   RTC 3 months  25 minutes spent dedicated to the care of this patient on the date of this encounter to include pre-visit review of  records, ordering of medication, post visit documentation, and face-to-face time with the patient discussing anxiety, depression, insomnia, ADHD and PTSD. Discussed continuing current medication regimen.  Patient advised to contact office with any questions, adverse effects, or acute worsening in signs and symptoms.  Discussed potential benefits, risks, and side effects of stimulants with patient to include increased heart rate, palpitations, insomnia, increased anxiety, increased irritability, or decreased appetite.  Instructed patient to contact office if experiencing any significant tolerability issues. Diagnoses and all orders for this visit:  Insomnia, unspecified type -     traZODone  (DESYREL ) 50 MG tablet; Take one to two tablets at bedtime as needed.  Major depressive disorder, recurrent episode, moderate (HCC) -     buPROPion  (WELLBUTRIN  XL) 150 MG 24 hr tablet; Take 3 tablets (450 mg total) by mouth every morning. -     escitalopram  (LEXAPRO ) 20 MG tablet; Take 1 tablet (20 mg total) by mouth daily.  Attention deficit hyperactivity disorder (ADHD), unspecified ADHD type -     buPROPion  (WELLBUTRIN  XL) 150 MG 24 hr tablet; Take 3 tablets (450 mg total) by  mouth every morning. -     amphetamine -dextroamphetamine (ADDERALL XR) 20 MG 24 hr capsule; Take 1 capsule (20 mg total) by mouth 2 (two) times daily.  Generalized anxiety disorder -     escitalopram  (LEXAPRO ) 20 MG tablet; Take 1 tablet (20 mg total) by mouth daily.  Other orders -     amphetamine -dextroamphetamine (ADDERALL XR) 20 MG 24 hr capsule; Take 1 capsule (20 mg total) by mouth 2 (two) times daily. -     amphetamine -dextroamphetamine (ADDERALL XR) 20 MG 24 hr capsule; Take 1 capsule (20 mg total) by mouth 2 (two) times daily.     Please see After Visit Summary for patient specific instructions.  No future appointments.   No orders of the defined types were placed in this encounter.      -------------------------------

## 2024-05-29 ENCOUNTER — Telehealth: Admitting: Adult Health

## 2024-05-29 ENCOUNTER — Encounter: Payer: Self-pay | Admitting: Adult Health

## 2024-05-29 DIAGNOSIS — F909 Attention-deficit hyperactivity disorder, unspecified type: Secondary | ICD-10-CM

## 2024-05-29 DIAGNOSIS — F411 Generalized anxiety disorder: Secondary | ICD-10-CM

## 2024-05-29 DIAGNOSIS — F32A Depression, unspecified: Secondary | ICD-10-CM | POA: Diagnosis not present

## 2024-05-29 DIAGNOSIS — F331 Major depressive disorder, recurrent, moderate: Secondary | ICD-10-CM

## 2024-05-29 DIAGNOSIS — F4310 Post-traumatic stress disorder, unspecified: Secondary | ICD-10-CM | POA: Diagnosis not present

## 2024-05-29 DIAGNOSIS — F902 Attention-deficit hyperactivity disorder, combined type: Secondary | ICD-10-CM

## 2024-05-29 DIAGNOSIS — G4700 Insomnia, unspecified: Secondary | ICD-10-CM | POA: Diagnosis not present

## 2024-05-29 DIAGNOSIS — F419 Anxiety disorder, unspecified: Secondary | ICD-10-CM

## 2024-05-29 MED ORDER — TRAZODONE HCL 50 MG PO TABS: ORAL_TABLET | ORAL | 2 refills | Status: DC

## 2024-05-29 MED ORDER — ESCITALOPRAM OXALATE 20 MG PO TABS: 20.0000 mg | ORAL_TABLET | Freq: Every day | ORAL | 2 refills | Status: DC

## 2024-05-29 NOTE — Progress Notes (Signed)
 Antonio [name] [id number] 04-27-1986 38 y.o.  Virtual Visit via Video Note  I connected with pt @ on 05/29/24 at 10:00 AM EDT by a video enabled telemedicine application and verified that I am speaking with the correct person using two identifiers.   I discussed the limitations of evaluation and management by telemedicine and the availability of in person appointments. The patient expressed understanding and agreed to proceed.  I discussed the assessment and treatment plan with the patient. The patient was provided an opportunity to ask questions and all were answered. The patient agreed with the plan and demonstrated an understanding of the instructions.   The patient was advised to call back or seek an in-person evaluation if the symptoms worsen or if the condition fails to improve as anticipated.  I provided 25 minutes of non-face-to-face time during this encounter.  The patient was located at home.  The provider was located at [facility].   Antonio LOISE Sayers, NP   Subjective:   Patient ID:  Antonio [name] is a 38 y.o. (DOB July 25, 1986) male.  Chief Complaint: No chief complaint on file.   HPI Antonio [name] presents for follow-up of anxiety, depression, insomnia, ADHD and PTSD.  Describes mood today as ok. Pleasant. Denies tearfulness. Mood symptoms - denies anxiety, depression or irritability. Reports stable interest and motivation. Denies panic attacks. Denies worry, rumination and over thinking. Reports mood is stable. Stating I feel like I'm doing pretty good - very well. Feels like current medication regimen works well. Taking medications as prescribe Energy levels varies with working the night shift. Active, does not have a regular exercise routine.  Enjoys some usual interests and activities. Married. Lives with wife and dog - [name]. Family local.  Appetite stable. Weight stable - 140 pounds. Sleeps well most nights. Averages 8 to 9 hours. Focus and  concentration stable - taking Adderall on days he works. Completing tasks. Managing aspects of household. 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  Denies SI or HI.  Denies AH or VH. Denies self harm. Denies substance use.    Review of Systems:  Review of Systems  Musculoskeletal:  Negative for gait problem.  Neurological:  Negative for tremors.  Psychiatric/Behavioral:         Please refer to HPI    Medications: I have reviewed the patient's current medications.  Current Outpatient Medications  Medication Sig Dispense Refill   acetaminophen  (TYLENOL ) 500 MG tablet Take 2 tablets (1,000 mg total) by mouth every 8 (eight) hours as needed. (Patient not taking: Reported on 02/16/2023) 30 tablet 0   acetaminophen  (TYLENOL ) 500 MG tablet Take 2 tablets (1,000 mg total) by mouth every 6 (six) hours as needed for mild pain.     amphetamine -dextroamphetamine (ADDERALL XR) 20 MG 24 hr capsule Take 1 capsule (20 mg total) by mouth 2 (two) times daily. 60 capsule 0   amphetamine -dextroamphetamine (ADDERALL XR) 20 MG 24 hr capsule Take 1 capsule (20 mg total) by mouth 2 (two) times daily. 60 capsule 0   amphetamine -dextroamphetamine (ADDERALL XR) 20 MG 24 hr capsule Take 1 capsule (20 mg total) by mouth 2 (two) times daily. 60 capsule 0   Ascorbic Acid (VITAMIN C PO) Take 1 tablet by mouth daily.     buPROPion  (WELLBUTRIN  XL) 150 MG 24 hr tablet Take 3 tablets (450 mg total) by mouth daily. 90 tablet 5   buPROPion  (WELLBUTRIN  XL) 150 MG 24 hr tablet Take 3  tablets (450 mg total) by mouth every morning. 90 tablet 2   docusate sodium  (COLACE) 100 MG capsule Take 1 capsule (100 mg total) by mouth 2 (two) times daily.     escitalopram  (LEXAPRO ) 20 MG tablet Take 1 tablet (20 mg total) by mouth daily. 30 tablet 2   Ferrous Sulfate (IRON PO) Take 1 tablet by mouth daily.     folic acid  (FOLVITE ) 1 MG tablet Take 1 tablet (1 mg total) by mouth daily. (Patient not taking: Reported on  02/16/2023)     HYDROcodone -acetaminophen  (NORCO) 7.5-325 MG tablet Take 1 tablet by mouth every 6 (six) hours as needed for moderate pain. 15 tablet 0   ibuprofen  (ADVIL ) 200 MG tablet Take 1 tablet (200 mg total) by mouth every 6 (six) hours as needed for moderate pain (pain not controlled with tylenol ).     meloxicam (MOBIC) 15 MG tablet Take 1 tablet by mouth daily.     methocarbamol  (ROBAXIN ) 500 MG tablet Take 1-2 tablets (500-1,000 mg total) by mouth every 6 (six) hours as needed for muscle spasms. 30 tablet 0   Multiple Vitamin (MULTIVITAMIN WITH MINERALS) TABS tablet Take 1 tablet by mouth daily. (Patient not taking: Reported on 02/16/2023)     Multiple Vitamin (MULTIVITAMIN WITH MINERALS) TABS tablet Take 1 tablet by mouth daily.     ondansetron  (ZOFRAN -ODT) 4 MG disintegrating tablet Take 1 tablet (4 mg total) by mouth every 8 (eight) hours as needed for nausea or vomiting. 20 tablet 0   oxyCODONE  10 MG TABS Take 0.5-1 tablets (5-10 mg total) by mouth every 6 (six) hours as needed for severe pain or moderate pain. 20 tablet 0   pantoprazole  (PROTONIX ) 40 MG tablet Take 1 tablet (40 mg total) by mouth daily. 30 tablet 3   pantoprazole  (PROTONIX ) 40 MG tablet Take 40 mg by mouth daily.     polyethylene glycol (MIRALAX  / GLYCOLAX ) 17 g packet Take 17 g by mouth daily as needed for mild constipation.     REXULTI  0.5 MG TABS Take 1 tablet (0.5 mg total) by mouth daily. (Patient not taking: Reported on 02/16/2023) 90 tablet 0   thiamine  (VITAMIN B-1) 100 MG tablet Take 1 tablet (100 mg total) by mouth daily. (Patient not taking: Reported on 02/16/2023)     traZODone  (DESYREL ) 50 MG tablet Take 100 mg by mouth at bedtime as needed.     traZODone  (DESYREL ) 50 MG tablet Take one to two tablets at bedtime as needed. 60 tablet 2   No current facility-administered medications for this visit.    Medication Side Effects: None  Allergies: No Known Allergies  Past Medical History:  Diagnosis Date    Anxiety 11/02/2015   Chronic pain of left ankle 04/24/2018   Depression 11/02/2015   Gastroesophageal reflux disease    question underlying stricture as pt is vomiting up undigested food particles. will try to obtain the ugi asap as he cannot be scheduled until feb 22.  spoke with dr kline who cannot add onto the schedule.  will attempt to schedule thru [facility] asap  Dec 04, 2008 Entered By: CORBETT PAC R Comment: nl EGD 1/09   GERD (gastroesophageal reflux disease)    Hemangioma 01/24/2016   HLD (hyperlipidemia)    Nephrolithiasis 05/19/2022   OSA on CPAP 01/06/2016   Home sleep study: 12/22/2015 Mild Sleep Apnea  Total apnea events over 3.6 hours = 22 Hypoxemia events for an index of 7.9 hours=48 10 obstructive apneas 11 central apneas  1 mixed apnea AHI= 11.6 per hour of sleep Cheyne-Stokes respirations were not observed Hypoventilation was not observed 41 desaturations occurred during the study Lowest saturation was 91% with an average of 95% Minimum saturati   Pityriasis versicolor 02/04/2019   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   Suicidal ideation 02/04/2019   Vitamin D  deficiency     No family history on file.  Social History   Socioeconomic History   Marital status: Married    Spouse name: Not on file   Number of children: Not on file   Years of education: Not on file   Highest education level: Not on file  Occupational History   Occupation: Build guns  Tobacco Use   Smoking status: Never   Smokeless tobacco: Never  Vaping Use   Vaping status: Never Used  Substance and Sexual Activity   Alcohol use: Yes    Alcohol/week: 10.0 - 15.0 standard drinks of alcohol    Types: 10 - 15 Cans of beer per week    Comment: 3-4 beers daily   Drug use: No   Sexual activity: Not on file  Other Topics Concern   Not on file  Social History Narrative   ** Merged History Encounter **       Social Drivers of Health   Financial Resource Strain: Not on file  Food  Insecurity: Low Risk  (10/17/2023)   Received from [facility]   Hunger Vital Sign    Within the past 12 months, you worried that your food would run out before you got money to buy more: Never true    Within the past 12 months, the food you bought just didn't last and you didn't have money to get more. : Never true  Recent Concern: Food Insecurity - High Risk (07/28/2023)   Received from [facility]   Hunger Vital Sign    Worried About Running Out of Food in the Last Year: Often true    Ran Out of Food in the Last Year: Never true  Transportation Needs: No Transportation Needs (10/17/2023)   Received from [facility]    In the past 12 months, has lack of reliable transportation kept you from medical appointments, meetings, work or from getting things needed for daily living? : No  Physical Activity: Not on file  Stress: Not on file  Social Connections: Not on file  Intimate Partner Violence: Not At Risk (03/30/2023)   Humiliation, Afraid, Rape, and Kick questionnaire    Fear of Current or Ex-Partner: No    Emotionally Abused: No    Physically Abused: No    Sexually Abused: No    Past Medical History, Surgical history, Social history, and Family history were reviewed and updated as appropriate.   Please see review of systems for further details on the patient's review from today.   Objective:   Physical Exam:  There were no vitals taken for this visit.  Physical Exam Constitutional:      General: He is not in acute distress. Musculoskeletal:        General: No deformity.  Neurological:     Mental Status: He is alert and oriented to person, place, and time.     Coordination: Coordination normal.  Psychiatric:        Attention and Perception: Attention and perception normal. He does not perceive auditory or visual hallucinations.        Mood and Affect: Mood normal. Mood is not anxious or depressed.  Affect is not labile, blunt, angry or inappropriate.         Speech: Speech normal.        Behavior: Behavior normal.        Thought Content: Thought content normal. Thought content is not paranoid or delusional. Thought content does not include homicidal or suicidal ideation. Thought content does not include homicidal or suicidal plan.        Cognition and Memory: Cognition and memory normal.        Judgment: Judgment normal.     Comments: Insight intact     Lab Review:     Component Value Date/Time   NA 136 08/23/2023 1134   NA 142 02/16/2014 1501   K 3.7 08/23/2023 1134   CL 100 08/23/2023 1134   CO2 31 08/23/2023 1134   GLUCOSE 92 08/23/2023 1134   BUN 17 08/23/2023 1134   BUN 13 02/16/2014 1501   CREATININE 1.07 08/23/2023 1134   CALCIUM 8.4 (L) 08/23/2023 1134   PROT 6.3 (L) 08/23/2023 1134   PROT 6.6 02/16/2014 1501   ALBUMIN 4.3 08/23/2023 1134   ALBUMIN 4.9 02/16/2014 1501   AST 25 08/23/2023 1134   ALT 19 08/23/2023 1134   ALKPHOS 77 08/23/2023 1134   BILITOT 1.3 (H) 08/23/2023 1134   GFRNONAA >60 08/23/2023 1134   GFRAA >60 02/22/2019 0239       Component Value Date/Time   WBC 5.4 08/23/2023 1134   RBC 4.83 08/23/2023 1134   HGB 14.3 08/23/2023 1134   HCT 41.1 08/23/2023 1134   PLT 270 08/23/2023 1134   MCV 85.1 08/23/2023 1134   MCV 86.9 08/01/2013 1040   MCH 29.6 08/23/2023 1134   MCHC 34.8 08/23/2023 1134   RDW 11.7 08/23/2023 1134   LYMPHSABS 1.0 02/16/2023 0400   MONOABS 1.5 (H) 02/16/2023 0400   EOSABS 0.0 02/16/2023 0400   BASOSABS 0.0 02/16/2023 0400    No results found for: POCLITH, LITHIUM   No results found for: PHENYTOIN, PHENOBARB, VALPROATE, CBMZ   .res Assessment: Plan:    Plan:  Trazadone 50mg  - 1 to 2 at hs Lexapro  20mg  daily  Wellbutrin  XL 450mg  daily Aderall XR 20mg  BID   Monitor BP between visits while taking stimulant medication.   RTC 3 months  25 minutes spent dedicated to the care of this patient on the date of this encounter to include pre-visit review of  records, ordering of medication, post visit documentation, and face-to-face time with the patient discussing anxiety, depression, insomnia, ADHD and PTSD. Discussed continuing current medication regimen.  Patient advised to contact office with any questions, adverse effects, or acute worsening in signs and symptoms.  Discussed potential benefits, risks, and side effects of stimulants with patient to include increased heart rate, palpitations, insomnia, increased anxiety, increased irritability, or decreased appetite.  Instructed patient to contact office if experiencing any significant tolerability issues.  There are no diagnoses linked to this encounter.   Please see After Visit Summary for patient specific instructions.  No future appointments.  No orders of the defined types were placed in this encounter.     -------------------------------

## 2024-06-11 ENCOUNTER — Telehealth: Payer: Self-pay | Admitting: Adult Health

## 2024-06-11 NOTE — Telephone Encounter (Signed)
 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 He doesn't need until January. Adderall 20mg  Wellbutrin  150mg , Lexapro  20mg , and Trazedone 50mg . Ph: [phone] Appt 1/5

## 2024-06-11 NOTE — Telephone Encounter (Signed)
 Pt has appt with Gina on 1/5, said RF could be sent at that time. He asked about a fax that he sent this morning to Gina's attn. Will look for it tomorrow.

## 2024-06-16 ENCOUNTER — Telehealth: Payer: Self-pay | Admitting: Adult Health

## 2024-06-16 NOTE — Telephone Encounter (Signed)
 Received behavioral Health letter of Stability guidelines. Placed in Antonio [name]'s box 11/17 PH: [phone] Appt 1/5

## 2024-06-16 NOTE — Telephone Encounter (Signed)
 Pt faxed behavioral health letter of stability guide lines for letter. 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 Need letter week prior. Also, need meds for 3 months. Apt 12/31 Fax given to [place]. She put in her box in front office.

## 2024-06-17 NOTE — Telephone Encounter (Signed)
 Apt 12/3 @ 3:00 for 60 mins

## 2024-07-02 ENCOUNTER — Telehealth (INDEPENDENT_AMBULATORY_CARE_PROVIDER_SITE_OTHER): Admitting: Adult Health

## 2024-07-02 ENCOUNTER — Encounter: Payer: Self-pay | Admitting: Adult Health

## 2024-07-02 DIAGNOSIS — G4700 Insomnia, unspecified: Secondary | ICD-10-CM

## 2024-07-02 DIAGNOSIS — F909 Attention-deficit hyperactivity disorder, unspecified type: Secondary | ICD-10-CM

## 2024-07-02 DIAGNOSIS — F419 Anxiety disorder, unspecified: Secondary | ICD-10-CM | POA: Diagnosis not present

## 2024-07-02 DIAGNOSIS — F4310 Post-traumatic stress disorder, unspecified: Secondary | ICD-10-CM | POA: Diagnosis not present

## 2024-07-02 DIAGNOSIS — F902 Attention-deficit hyperactivity disorder, combined type: Secondary | ICD-10-CM

## 2024-07-02 DIAGNOSIS — F32A Depression, unspecified: Secondary | ICD-10-CM

## 2024-07-02 DIAGNOSIS — F331 Major depressive disorder, recurrent, moderate: Secondary | ICD-10-CM

## 2024-07-02 DIAGNOSIS — F411 Generalized anxiety disorder: Secondary | ICD-10-CM

## 2024-07-02 MED ORDER — AMPHETAMINE-DEXTROAMPHET ER 20 MG PO CP24: 20.0000 mg | ORAL_CAPSULE | Freq: Two times a day (BID) | ORAL | 0 refills | Status: DC

## 2024-07-02 NOTE — Progress Notes (Signed)
**Note Antonio-Identified via Obfuscation**  Antonio [name] [id number] Jun 27, 1986 38 y.o.  Virtual Visit via Video Note  I connected with pt @ on 07/02/24 at  3:00 PM EST by a video enabled telemedicine application and verified that I am speaking with the correct person using two identifiers.   I discussed the limitations of evaluation and management by telemedicine and the availability of in person appointments. The patient expressed understanding and agreed to proceed.  I discussed the assessment and treatment plan with the patient. The patient was provided an opportunity to ask questions and all were answered. The patient agreed with the plan and demonstrated an understanding of the instructions.   The patient was advised to call back or seek an in-person evaluation if the symptoms worsen or if the condition fails to improve as anticipated.  I provided 40 minutes of non-face-to-face time during this encounter.  The patient was located at home.  The provider was located at [facility].   Antonio LOISE Sayers, NP   Subjective:   Patient ID:  Antonio [name] is a 38 y.o. (DOB [birth year]) male.  Chief Complaint: No chief complaint on file.   HPI Antonio [name] presents for follow-up of anxiety, depression, insomnia, ADHD and PTSD.  Describes mood today as ok. Pleasant. Denies tearfulness. Mood symptoms - denies anxiety, depression or irritability. Reports stable interest and motivation. Denies panic attacks. Denies worry, rumination and over thinking. Reports mood is stable. Stating I feel like I'm doing pretty good. Feels like current medication regimen works well. Taking medications as prescribed. Energy levels vary with working a night shift schedule. Active, does not have a regular exercise routine.  Enjoys some usual interests and activities. Married. Lives with wife and dog Family local.  Appetite stable. Weight stable - 140 pounds. Sleeps well most nights. Averages 8 to 9 hours. Focus and concentration  stable - typically taking Adderall on days he works. Completing tasks. Managing aspects of household. 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  Denies SI or HI.  Denies AH or VH. Denies self harm. Denies substance use.    Review of Systems:  Review of Systems  Musculoskeletal:  Negative for gait problem.  Neurological:  Negative for tremors.  Psychiatric/Behavioral:         Please refer to HPI    Medications: I have reviewed the patient's current medications.  Current Outpatient Medications  Medication Sig Dispense Refill   acetaminophen  (TYLENOL ) 500 MG tablet Take 2 tablets (1,000 mg total) by mouth every 8 (eight) hours as needed. (Patient not taking: Reported on 02/16/2023) 30 tablet 0   acetaminophen  (TYLENOL ) 500 MG tablet Take 2 tablets (1,000 mg total) by mouth every 6 (six) hours as needed for mild pain.     amphetamine -dextroamphetamine (ADDERALL XR) 20 MG 24 hr capsule Take 1 capsule (20 mg total) by mouth 2 (two) times daily. 60 capsule 0   amphetamine -dextroamphetamine (ADDERALL XR) 20 MG 24 hr capsule Take 1 capsule (20 mg total) by mouth 2 (two) times daily. 60 capsule 0   amphetamine -dextroamphetamine (ADDERALL XR) 20 MG 24 hr capsule Take 1 capsule (20 mg total) by mouth 2 (two) times daily. 60 capsule 0   Ascorbic Acid (VITAMIN C PO) Take 1 tablet by mouth daily.     buPROPion  (WELLBUTRIN  XL) 150 MG 24 hr tablet Take 3 tablets (450 mg total) by mouth daily. 90 tablet 5   buPROPion  (WELLBUTRIN  XL) 150 MG 24 hr tablet Take 3 tablets (450  mg total) by mouth every morning. 90 tablet 2   docusate sodium  (COLACE) 100 MG capsule Take 1 capsule (100 mg total) by mouth 2 (two) times daily.     escitalopram  (LEXAPRO ) 20 MG tablet Take 1 tablet (20 mg total) by mouth daily. 30 tablet 2   Ferrous Sulfate (IRON PO) Take 1 tablet by mouth daily.     folic acid  (FOLVITE ) 1 MG tablet Take 1 tablet (1 mg total) by mouth daily. (Patient not taking: Reported on  02/16/2023)     HYDROcodone -acetaminophen  (NORCO) 7.5-325 MG tablet Take 1 tablet by mouth every 6 (six) hours as needed for moderate pain. 15 tablet 0   ibuprofen  (ADVIL ) 200 MG tablet Take 1 tablet (200 mg total) by mouth every 6 (six) hours as needed for moderate pain (pain not controlled with tylenol ).     meloxicam (MOBIC) 15 MG tablet Take 1 tablet by mouth daily.     methocarbamol  (ROBAXIN ) 500 MG tablet Take 1-2 tablets (500-1,000 mg total) by mouth every 6 (six) hours as needed for muscle spasms. 30 tablet 0   Multiple Vitamin (MULTIVITAMIN WITH MINERALS) TABS tablet Take 1 tablet by mouth daily. (Patient not taking: Reported on 02/16/2023)     Multiple Vitamin (MULTIVITAMIN WITH MINERALS) TABS tablet Take 1 tablet by mouth daily.     ondansetron  (ZOFRAN -ODT) 4 MG disintegrating tablet Take 1 tablet (4 mg total) by mouth every 8 (eight) hours as needed for nausea or vomiting. 20 tablet 0   oxyCODONE  10 MG TABS Take 0.5-1 tablets (5-10 mg total) by mouth every 6 (six) hours as needed for severe pain or moderate pain. 20 tablet 0   pantoprazole  (PROTONIX ) 40 MG tablet Take 1 tablet (40 mg total) by mouth daily. 30 tablet 3   pantoprazole  (PROTONIX ) 40 MG tablet Take 40 mg by mouth daily.     polyethylene glycol (MIRALAX  / GLYCOLAX ) 17 g packet Take 17 g by mouth daily as needed for mild constipation.     thiamine  (VITAMIN B-1) 100 MG tablet Take 1 tablet (100 mg total) by mouth daily. (Patient not taking: Reported on 02/16/2023)     traZODone  (DESYREL ) 50 MG tablet Take one to two tablets at bedtime as needed. 60 tablet 2   No current facility-administered medications for this visit.    Medication Side Effects: None  Allergies: No Known Allergies  Past Medical History:  Diagnosis Date   Anxiety 11/02/2015   Chronic pain of left ankle 04/24/2018   Depression 11/02/2015   Gastroesophageal reflux disease    question underlying stricture as pt is vomiting up undigested food particles. will try  to obtain the ugi asap as he cannot be scheduled until feb 22.  spoke with dr kline who cannot add onto the schedule.  will attempt to schedule thru [facility] asap  Dec 04, 2008 Entered By: CORBETT PAC R Comment: nl EGD 1/09   GERD (gastroesophageal reflux disease)    Hemangioma 01/24/2016   HLD (hyperlipidemia)    Nephrolithiasis 05/19/2022   OSA on CPAP 01/06/2016   Home sleep study: 12/22/2015 Mild Sleep Apnea  Total apnea events over 3.6 hours = 22 Hypoxemia events for an index of 7.9 hours=48 10 obstructive apneas 11 central apneas 1 mixed apnea AHI= 11.6 per hour of sleep Cheyne-Stokes respirations were not observed Hypoventilation was not observed 41 desaturations occurred during the study Lowest saturation was 91% with an average of 95% Minimum saturati   Pityriasis versicolor 02/04/2019   Rupture of  posterior tibialis tendon 05/09/2019   Suicidal ideation 02/04/2019   Vitamin D  deficiency     No family history on file.  Social History   Socioeconomic History   Marital status: Married    Spouse name: Not on file   Number of children: Not on file   Years of education: Not on file   Highest education level: Not on file  Occupational History   Occupation: Build guns  Tobacco Use   Smoking status: Never   Smokeless tobacco: Never  Vaping Use   Vaping status: Never Used  Substance and Sexual Activity   Alcohol use: Yes    Alcohol/week: 10.0 - 15.0 standard drinks of alcohol    Types: 10 - 15 Cans of beer per week    Comment: 3-4 beers daily   Drug use: No   Sexual activity: Not on file  Other Topics Concern   Not on file  Social History Narrative   ** Merged History Encounter **       Social Drivers of Health   Financial Resource Strain: Not on file  Food Insecurity: Low Risk  (06/20/2024)   Received from [facility]   Hunger Vital Sign    Within the past 12 months, you worried that your food would run out before you got money to buy more: Never true     Within the past 12 months, the food you bought just didn't last and you didn't have money to get more. : Never true  Transportation Needs: No Transportation Needs (06/20/2024)   Received from [facility]    In the past 12 months, has lack of reliable transportation kept you from medical appointments, meetings, work or from getting things needed for daily living? : No  Physical Activity: Not on file  Stress: Not on file  Social Connections: Not on file  Intimate Partner Violence: Not At Risk (03/30/2023)   Humiliation, Afraid, Rape, and Kick questionnaire    Fear of Current or Ex-Partner: No    Emotionally Abused: No    Physically Abused: No    Sexually Abused: No    Past Medical History, Surgical history, Social history, and Family history were reviewed and updated as appropriate.   Please see review of systems for further details on the patient's review from today.   Objective:   Physical Exam:  There were no vitals taken for this visit.  Physical Exam Constitutional:      General: He is not in acute distress. Musculoskeletal:        General: No deformity.  Neurological:     Mental Status: He is alert and oriented to person, place, and time.     Coordination: Coordination normal.  Psychiatric:        Attention and Perception: Attention and perception normal. He does not perceive auditory or visual hallucinations.        Mood and Affect: Mood normal. Mood is not anxious or depressed. Affect is not labile, blunt, angry or inappropriate.        Speech: Speech normal.        Behavior: Behavior normal.        Thought Content: Thought content normal. Thought content is not paranoid or delusional. Thought content does not include homicidal or suicidal ideation. Thought content does not include homicidal or suicidal plan.        Cognition and Memory: Cognition and memory normal.        Judgment: Judgment normal.  Comments: Insight intact     Lab Review:      Component Value Date/Time   NA 136 08/23/2023 1134   NA 142 02/16/2014 1501   K 3.7 08/23/2023 1134   CL 100 08/23/2023 1134   CO2 31 08/23/2023 1134   GLUCOSE 92 08/23/2023 1134   BUN 17 08/23/2023 1134   BUN 13 02/16/2014 1501   CREATININE 1.07 08/23/2023 1134   CALCIUM 8.4 (L) 08/23/2023 1134   PROT 6.3 (L) 08/23/2023 1134   PROT 6.6 02/16/2014 1501   ALBUMIN 4.3 08/23/2023 1134   ALBUMIN 4.9 02/16/2014 1501   AST 25 08/23/2023 1134   ALT 19 08/23/2023 1134   ALKPHOS 77 08/23/2023 1134   BILITOT 1.3 (H) 08/23/2023 1134   GFRNONAA >60 08/23/2023 1134   GFRAA >60 02/22/2019 0239       Component Value Date/Time   WBC 5.4 08/23/2023 1134   RBC 4.83 08/23/2023 1134   HGB 14.3 08/23/2023 1134   HCT 41.1 08/23/2023 1134   PLT 270 08/23/2023 1134   MCV 85.1 08/23/2023 1134   MCV 86.9 08/01/2013 1040   MCH 29.6 08/23/2023 1134   MCHC 34.8 08/23/2023 1134   RDW 11.7 08/23/2023 1134   LYMPHSABS 1.0 02/16/2023 0400   MONOABS 1.5 (H) 02/16/2023 0400   EOSABS 0.0 02/16/2023 0400   BASOSABS 0.0 02/16/2023 0400    No results found for: POCLITH, LITHIUM   No results found for: PHENYTOIN, PHENOBARB, VALPROATE, CBMZ   .res Assessment: Plan:    Plan:  Trazadone 50mg  - 1 to 2 at hs Lexapro  20mg  daily  Wellbutrin  XL 450mg  daily Aderall XR 20mg  BID   Monitor BP between visits while taking stimulant medication.   RTC 3 months  40 minutes spent dedicated to the care of this patient on the date of this encounter to include pre-visit review of records, ordering of medication, post visit documentation, and face-to-face time with the patient discussing anxiety, depression, insomnia, ADHD and PTSD. Discussed continuing current medication regimen. Completed Behavioral Health letter of stability.  Patient advised to contact office with any questions, adverse effects, or acute worsening in signs and symptoms.  Discussed potential benefits, risks, and side effects of  stimulants with patient to include increased heart rate, palpitations, insomnia, increased anxiety, increased irritability, or decreased appetite.  Instructed patient to contact office if experiencing any significant tolerability issues.   There are no diagnoses linked to this encounter.   Please see After Visit Summary for patient specific instructions.  Future Appointments  Date Time Provider Department Center  07/02/2024  3:00 PM Laela Deviney Nattalie, NP [facility] None    No orders of the defined types were placed in this encounter.     -------------------------------

## 2024-07-07 ENCOUNTER — Telehealth: Payer: Self-pay | Admitting: Adult Health

## 2024-07-07 NOTE — Telephone Encounter (Signed)
 Pt LVM @ 1:10p stating he picked up a  letter from [employer] today but he has been informed he needs the notes from the last 3 visits.  You can call Mr. Christ at [phone] (cell) or [phone] x [phone] (office) if you need more information.   No upcoming appt scheduled.

## 2024-07-07 NOTE — Telephone Encounter (Signed)
Please call to see what is needed.

## 2024-07-09 NOTE — Telephone Encounter (Signed)
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

## 2024-07-09 NOTE — Telephone Encounter (Signed)
 LVM at 8:53a today for him to call back to confirm, but the original message said the notes from the last 3 visits.

## 2024-07-09 NOTE — Telephone Encounter (Signed)
 Working on this. Called pt and lvm to look on his my chart for notes. If he can not see them advise us  ASAP to request STAT. Advised also, charge if we request. Need notes from 12/3, 10/30 & 02/27/24. Waiting to hear from pt.

## 2024-07-10 NOTE — Telephone Encounter (Signed)
 Pt returned call and Andree was able to help Pt get visit notes through his my chart.

## 2024-07-29 ENCOUNTER — Telehealth: Payer: Self-pay | Admitting: Adult Health

## 2024-07-29 DIAGNOSIS — F411 Generalized anxiety disorder: Secondary | ICD-10-CM

## 2024-07-29 DIAGNOSIS — F909 Attention-deficit hyperactivity disorder, unspecified type: Secondary | ICD-10-CM

## 2024-07-29 DIAGNOSIS — F331 Major depressive disorder, recurrent, moderate: Secondary | ICD-10-CM

## 2024-07-29 DIAGNOSIS — G4700 Insomnia, unspecified: Secondary | ICD-10-CM

## 2024-07-29 NOTE — Telephone Encounter (Signed)
 Pt just wants all of his medications sent to [facility].

## 2024-07-29 NOTE — Telephone Encounter (Signed)
 Patient called in stating that [facility] is asking for copies of his medications sent to [facility]. I believe he is needing refills for Adderall 20mg  Bupropion  450mg , Lexapro  20mg  and Trazedone 50mg  sent to [facility]. Pls rtc to clarify as he is going check to see exactly what they are needing. PH: [phone] Appt 1/5

## 2024-07-30 ENCOUNTER — Other Ambulatory Visit: Payer: Self-pay

## 2024-07-30 ENCOUNTER — Telehealth: Admitting: Adult Health

## 2024-07-30 DIAGNOSIS — F909 Attention-deficit hyperactivity disorder, unspecified type: Secondary | ICD-10-CM

## 2024-07-30 MED ORDER — TRAZODONE HCL 50 MG PO TABS: ORAL_TABLET | ORAL | 1 refills | Status: AC

## 2024-07-30 MED ORDER — ESCITALOPRAM OXALATE 20 MG PO TABS: 20.0000 mg | ORAL_TABLET | Freq: Every day | ORAL | 1 refills | Status: AC

## 2024-07-30 MED ORDER — AMPHETAMINE-DEXTROAMPHET ER 20 MG PO CP24: 20.0000 mg | ORAL_CAPSULE | Freq: Two times a day (BID) | ORAL | 0 refills | Status: DC

## 2024-07-30 MED ORDER — BUPROPION HCL ER (XL) 150 MG PO TB24: 450.0000 mg | ORAL_TABLET | Freq: Every morning | ORAL | 1 refills | Status: AC

## 2024-07-30 NOTE — Telephone Encounter (Signed)
 Sent bupropion , Lexapro , and trazodone  to [facility]. Pended Adderall.

## 2024-08-01 ENCOUNTER — Telehealth: Payer: Self-pay | Admitting: Adult Health

## 2024-08-01 NOTE — Telephone Encounter (Signed)
 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  He said to call [facility] at [phone] for help with his request.  He is scheduled to have an appt in March, but says he may be deployed by then.

## 2024-08-04 ENCOUNTER — Telehealth: Admitting: Adult Health

## 2024-08-05 NOTE — Telephone Encounter (Signed)
 LVM to [facility]

## 2024-08-05 NOTE — Telephone Encounter (Signed)
 90-day script with 1 RF were sent to [facility] for Wellbutrin  and escitalopram . Need to send Rx for Adderall as well for 90 days. Pt is aware.

## 2024-08-06 ENCOUNTER — Other Ambulatory Visit: Payer: Self-pay

## 2024-08-06 DIAGNOSIS — F909 Attention-deficit hyperactivity disorder, unspecified type: Secondary | ICD-10-CM

## 2024-08-06 MED ORDER — AMPHETAMINE-DEXTROAMPHET ER 20 MG PO CP24: 20.0000 mg | ORAL_CAPSULE | Freq: Two times a day (BID) | ORAL | 0 refills | Status: AC

## 2024-08-06 NOTE — Telephone Encounter (Signed)
 Pended Adderall to [facility] for 90 days. All other meds have already been sent and patient is aware.
